# Patient Record
Sex: Female | Born: 1960 | Race: Black or African American | Hispanic: No | Marital: Married | Smoking: Current every day smoker
Health system: Southern US, Community
[De-identification: ages and names within clinical notes are randomized; demographics above are authoritative.]

## PROBLEM LIST (undated history)

## (undated) HISTORY — PX: OTHER SURGICAL HISTORY: SHX169

---

## 2019-05-31 ENCOUNTER — Ambulatory Visit (INDEPENDENT_AMBULATORY_CARE_PROVIDER_SITE_OTHER): Payer: PRIVATE HEALTH INSURANCE | Admitting: Internal Medicine

## 2019-05-31 ENCOUNTER — Other Ambulatory Visit: Payer: Self-pay

## 2019-05-31 ENCOUNTER — Encounter: Payer: Self-pay | Admitting: Internal Medicine

## 2019-05-31 VITALS — BP 118/58 | HR 68 | Temp 98.0°F | Ht 60.32 in | Wt 163.4 lb

## 2019-05-31 DIAGNOSIS — E041 Nontoxic single thyroid nodule: Secondary | ICD-10-CM

## 2019-05-31 DIAGNOSIS — R748 Abnormal levels of other serum enzymes: Secondary | ICD-10-CM | POA: Diagnosis not present

## 2019-05-31 DIAGNOSIS — E0590 Thyrotoxicosis, unspecified without thyrotoxic crisis or storm: Secondary | ICD-10-CM

## 2019-05-31 DIAGNOSIS — R799 Abnormal finding of blood chemistry, unspecified: Secondary | ICD-10-CM

## 2019-05-31 DIAGNOSIS — D729 Disorder of white blood cells, unspecified: Secondary | ICD-10-CM

## 2019-05-31 LAB — COMPREHENSIVE METABOLIC PANEL
ALT: 21 U/L (ref 0–35)
AST: 19 U/L (ref 0–37)
Albumin: 4.2 g/dL (ref 3.5–5.2)
Alkaline Phosphatase: 133 U/L — ABNORMAL HIGH (ref 39–117)
BUN: 12 mg/dL (ref 6–23)
CO2: 28 mEq/L (ref 19–32)
Calcium: 10 mg/dL (ref 8.4–10.5)
Chloride: 101 mEq/L (ref 96–112)
Creatinine, Ser: 1.01 mg/dL (ref 0.40–1.20)
GFR: 68.1 mL/min (ref >60.00)
Glucose, Bld: 115 mg/dL — ABNORMAL HIGH (ref 70–99)
Potassium: 3.4 mEq/L — ABNORMAL LOW (ref 3.5–5.1)
Sodium: 138 mEq/L (ref 135–145)
Total Bilirubin: 0.5 mg/dL (ref 0.2–1.2)
Total Protein: 7.4 g/dL (ref 6.0–8.3)

## 2019-05-31 LAB — CBC WITH DIFFERENTIAL/PLATELET
Basophils Absolute: 0 10*3/uL (ref 0.0–0.1)
Basophils Relative: 0.7 % (ref 0.0–3.0)
Eosinophils Absolute: 0.1 10*3/uL (ref 0.0–0.7)
Eosinophils Relative: 1.5 % (ref 0.0–5.0)
HCT: 41.6 % (ref 36.0–46.0)
Hemoglobin: 14 g/dL (ref 12.0–15.0)
Lymphocytes Relative: 49.1 % — ABNORMAL HIGH (ref 12.0–46.0)
Lymphs Abs: 3.4 10*3/uL (ref 0.7–4.0)
MCHC: 33.6 g/dL (ref 30.0–36.0)
MCV: 97.8 fl (ref 78.0–100.0)
Monocytes Absolute: 0.5 10*3/uL (ref 0.1–1.0)
Monocytes Relative: 7.4 % (ref 3.0–12.0)
Neutro Abs: 2.9 10*3/uL (ref 1.4–7.7)
Neutrophils Relative %: 41.3 % — ABNORMAL LOW (ref 43.0–77.0)
Platelets: 193 10*3/uL (ref 150.0–400.0)
RBC: 4.26 Mil/uL (ref 3.87–5.11)
RDW: 13.4 % (ref 11.5–15.5)
WBC: 7 10*3/uL (ref 4.0–10.5)

## 2019-05-31 LAB — T4, FREE: Free T4: 0.62 ng/dL (ref 0.60–1.60)

## 2019-05-31 LAB — TSH: TSH: 7.11 u[IU]/mL — ABNORMAL HIGH (ref 0.35–4.50)

## 2019-05-31 NOTE — Progress Notes (Signed)
Name: Julie Gutierrez  MRN/ DOB: [id number], 10-30-1960    Age/ Sex: 58 y.o., female    PCP: Shifflett, Lorelee Market   Reason for Endocrinology Evaluation: Hyperthyroidism     Date of Initial Endocrinology Evaluation: 06/01/2019     HPI: [name] is a 58 y.o. female with a past medical history of HTN, Dyslipidemia. The patient presented for initial endocrinology clinic visit on 06/01/2019 for consultative assistance with her hyperthyroidism.   Pt has been diagnosed with right thyroid nodule in her 30's. She recalls having a thyroid uptake and scan at the time as well as a biopsy with benign findings per pt. In 2019 she was started on Methimazole due to thyroid overactivity in 2019.  She also had a right nodule FNA on 6/19/2019with benign cytology per patient. (Previous endocrinology notes not available)    Medications Methimazole 10 mg daily   Today she denies any local neck symptoms such as discomfort , dysphagia or hoarseness.   Denies nausea or abdominal abdominal pain  Has gained weight over the past 5 months. Denies palpitations and diarrhea.   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  Past Surgical History: The histories are not reviewed yet. Please review them in the "History" navigator section and refresh this [place].   Social History:  reports that she has been smoking. She has never used smokeless tobacco. She reports that she does not drink alcohol or use drugs.  Family History: family history includes Julie Gutierrez in her mother; Julie Gutierrez in her father; Goiter in her sister.   HOME MEDICATIONS: Allergies as of 05/31/2019      Reactions   Prednisone Other (See Comments)   hallucinations   Sulfacetamide Hives      Medication List       Accurate as of May 31, 2019 11:59 PM. If you have any questions, ask your nurse or  doctor.        amLODipine 10 MG tablet Commonly known as: NORVASC Take by mouth.   atenolol 50 MG tablet Commonly known as: TENORMIN Take by mouth.   atorvastatin 20 MG tablet Commonly known as: LIPITOR Take 20 mg by mouth at bedtime.   escitalopram 5 MG tablet Commonly known as: LEXAPRO Take by mouth.   fexofenadine 60 MG tablet Commonly known as: ALLEGRA Take by mouth.   fluticasone 50 MCG/ACT nasal spray Commonly known as: FLONASE Place into the nose.   meclizine 25 MG tablet Commonly known as: ANTIVERT Take by mouth.   methimazole 10 MG tablet Commonly known as: TAPAZOLE Take 10 mg by mouth every morning.   multivitamin-iron-minerals-folic acid chewable tablet Chew 1 tablet by mouth daily.   omeprazole 20 MG capsule Commonly known as: PRILOSEC Take by mouth.   triamterene-hydrochlorothiazide 37.5-25 MG tablet Commonly known as: MAXZIDE-25 Take 1 tablet by mouth daily.         REVIEW OF SYSTEMS: A comprehensive ROS was conducted with the patient and is negative except as per HPI and below:  Review of Systems  Eyes: Negative for blurred vision and pain.  Respiratory: Negative for cough and shortness of breath.   Cardiovascular: Negative for chest pain and palpitations.  Gastrointestinal: Negative for diarrhea and nausea.  Neurological: Negative for tingling and tremors.  Psychiatric/Behavioral: Negative for depression. The patient is nervous/anxious.  OBJECTIVE:  VS: BP (!) 118/58 (BP Location: Right Arm, Patient Position: Sitting, Cuff Size: Large)    Pulse 68    Temp 98 F (36.7 C)    Ht 5' 0.32" (1.532 m)    Wt 163 lb 6.4 oz (74.1 kg)    SpO2 98%    BMI 31.58 kg/m    Wt Readings from Last 3 Encounters:  05/31/19 163 lb 6.4 oz (74.1 kg)     EXAM: General: Pt appears well and is in NAD  Hydration: Well-hydrated with moist mucous membranes and good skin turgor  Eyes: External eye exam normal without stare, lid lag or exophthalmos.   EOM intact.  PERRL.  Ears, Nose, Throat: Hearing: Grossly intact bilaterally Dental: Good dentition  Throat: Clear without mass, erythema or exudate  Neck: General: Supple without adenopathy. Thyroid: Thyroid size normal.  Isthmic nodule appreciated. No thyroid bruit.  Lungs: Clear with good BS bilat with no rales, rhonchi, or wheezes  Heart: Auscultation: RRR.  Abdomen: Normoactive bowel sounds, soft, nontender, without masses or organomegaly palpable  Extremities:  BL LE: No pretibial edema normal ROM and strength.  Skin: Hair: Texture and amount normal with gender appropriate distribution Skin Inspection: No rashes Skin Palpation: Skin temperature, texture, and thickness normal to palpation  Neuro: Cranial nerves: II - XII grossly intact  Motor: Normal strength throughout DTRs: 2+ and symmetric in UE without delay in relaxation phase  Mental Status: Judgment, insight: Intact Orientation: Oriented to time, place, and person Mood and affect: No depression, anxiety, or agitation     DATA REVIEWED:   Results for JANISHA, BUESO (MRN [id number]) as of 06/01/2019 08:02  Ref. Range 05/31/2019 14:41  Sodium Latest Ref Range: 135 - 145 mEq/L 138  Potassium Latest Ref Range: 3.5 - 5.1 mEq/L 3.4 (L)  Chloride Latest Ref Range: 96 - 112 mEq/L 101  CO2 Latest Ref Range: 19 - 32 mEq/L 28  Glucose Latest Ref Range: 70 - 99 mg/dL 115 (H)  BUN Latest Ref Range: 6 - 23 mg/dL 12  Creatinine Latest Ref Range: 0.40 - 1.20 mg/dL 1.01  Calcium Latest Ref Range: 8.4 - 10.5 mg/dL 10.0  Alkaline Phosphatase Latest Ref Range: 39 - 117 U/L 133 (H)  Albumin Latest Ref Range: 3.5 - 5.2 g/dL 4.2  AST Latest Ref Range: 0 - 37 U/L 19  ALT Latest Ref Range: 0 - 35 U/L 21  Total Protein Latest Ref Range: 6.0 - 8.3 g/dL 7.4  Total Bilirubin Latest Ref Range: 0.2 - 1.2 mg/dL 0.5  GFR Latest Ref Range: >60.00 mL/min 68.10  WBC Latest Ref Range: 4.0 - 10.5 K/uL 7.0  RBC Latest Ref Range: 3.87 - 5.11 Mil/uL 4.26    Hemoglobin Latest Ref Range: 12.0 - 15.0 g/dL 14.0  HCT Latest Ref Range: 36.0 - 46.0 % 41.6  MCV Latest Ref Range: 78.0 - 100.0 fl 97.8  MCHC Latest Ref Range: 30.0 - 36.0 g/dL 33.6  RDW Latest Ref Range: 11.5 - 15.5 % 13.4  Platelets Latest Ref Range: 150.0 - 400.0 K/uL 193.0  Neutrophils Latest Ref Range: 43.0 - 77.0 % 41.3 (L)  Lymphocytes Latest Ref Range: 12.0 - 46.0 % 49.1 (H)  Monocytes Relative Latest Ref Range: 3.0 - 12.0 % 7.4  Eosinophil Latest Ref Range: 0.0 - 5.0 % 1.5  Basophil Latest Ref Range: 0.0 - 3.0 % 0.7  NEUT# Latest Ref Range: 1.4 - 7.7 K/uL 2.9  Lymphocyte # Latest Ref Range: 0.7 - 4.0 K/uL 3.4  Monocyte #  Latest Ref Range: 0.1 - 1.0 K/uL 0.5  Eosinophils Absolute Latest Ref Range: 0.0 - 0.7 K/uL 0.1  Basophils Absolute Latest Ref Range: 0.0 - 0.1 K/uL 0.0  TSH Latest Ref Range: 0.35 - 4.50 uIU/mL 7.11 (H)  T4,Free(Direct) Latest Ref Range: 0.60 - 1.60 ng/dL 0.62    ASSESSMENT/PLAN/RECOMMENDATIONS:   1. Toxic Thyroid Nodule :   - This is per pt's history, no record available today  - She is clinically euthyroid  - We discussed that if she indeed has a toxic nodule , the treatment of choice is RAI ablation, if she declines that then thionamide therapy will be accepted.  - We discussed with pt the benefits of methimazole in the Tx of hyperthyroidism, as well as the possible side effects/complications of anti-thyroid drug Tx (specifically detailing the rare, but serious side effect of agranulocytosis). She was informed of need for regular thyroid function monitoring while on methimazole to ensure appropriate dosage without over-treatment. As well, we discussed the possible side effects of methimazole including the chance of rash, the small chance of liver irritation/juandice and the <=1 in 300-400 chance of sudden onset agranulocytosis.  We discussed importance of going to urgent care promptly (and stopping methimazole) if she were to develop significant fever with  severe sore throat of other evidence of acute infection.     - Carefully explained to the patient that one of the consequences of I-131 ablation treatment could be permanent hypothyroidism which would require long-term replacement therapy with LT4. - Will proceed with uptake and scan , pt reminded to hold off on methimazole intake 7 days prior to her scan appointment and to resume it after completion.   - TSH is elevated, will reduce methimazole dose as below   Medications : Hold Methimazole 10 mg for 2 days and restart at half a tablet daily    2. Low Neutrophil Count   - No baseline neutrophil count,unclear if this is related to methimazole or not - No evidence of active infection - Will monitor while reducing methimazole dose   3. Elevated Alkaline phosphatase:   - No baseline of Alk. Phos in the past , unclear if this related to methimazole  - Will continue to monitor with reduced dose of methimazole - No GI symptoms    F/u in 3 months     Signed electronically by: Mack Guise, MD  [facility] Endocrin[facility]  [facility] [address] [place], [place] [address] Phone: [phone] FAX: [phone]   CC: Shifflett, Lorelee Market [place] [address] Phone: [phone] Fax: [phone]   Return to Endocrinology clinic as below: Future Appointments  Date Time Provider [place]  08/17/2019 11:10 AM Zailyn Rowser, Melanie Crazier, MD [id number] None

## 2019-05-31 NOTE — Patient Instructions (Addendum)
We recommend that you follow these hyperthyroidism instructions at home:  1) Take Methimazole 10 mg, 1 tablet daily   If you develop severe sore throat with high fevers OR develop unexplained yellowing of your skin, eyes, under your tongue, severe abdominal pain with nausea or vomiting --> then please get evaluated immediately.    2) We will set you up for a thyroid Uptake and Scan - remember to hold Methimazole 7 days before your scan and you can go back on it after your scan is complete.

## 2019-06-01 ENCOUNTER — Telehealth: Payer: Self-pay | Admitting: Internal Medicine

## 2019-06-01 DIAGNOSIS — D729 Disorder of white blood cells, unspecified: Secondary | ICD-10-CM | POA: Insufficient documentation

## 2019-06-01 DIAGNOSIS — R799 Abnormal finding of blood chemistry, unspecified: Secondary | ICD-10-CM | POA: Insufficient documentation

## 2019-06-01 DIAGNOSIS — R748 Abnormal levels of other serum enzymes: Secondary | ICD-10-CM | POA: Insufficient documentation

## 2019-06-01 NOTE — Telephone Encounter (Signed)
lft vm to return call, name given on vm is not the name of pt

## 2019-06-01 NOTE — Telephone Encounter (Signed)
Please let her know the current dose of methimazole is too much for her. Her thyroid is currently underactive rather then overactive.     Hold methimazole for 2 days, then restart at half a tablet.    Please remind her to hold off on methimazole for a week prior to her appointment for the thyroid scan (I also wrote it on her AVS)   Thanks

## 2019-06-02 ENCOUNTER — Other Ambulatory Visit: Payer: Self-pay

## 2019-06-02 MED ORDER — METHIMAZOLE 10 MG PO TABS: 5.0000 mg | ORAL_TABLET | Freq: Every morning | ORAL | 1 refills | Status: DC

## 2019-06-02 NOTE — Telephone Encounter (Signed)
Spoke to pt and reviewed results, pt stated she understood results and changes also requested a refill which was sent.

## 2019-08-17 ENCOUNTER — Ambulatory Visit (INDEPENDENT_AMBULATORY_CARE_PROVIDER_SITE_OTHER): Payer: PRIVATE HEALTH INSURANCE | Admitting: Internal Medicine

## 2019-08-17 ENCOUNTER — Other Ambulatory Visit: Payer: Self-pay

## 2019-08-17 ENCOUNTER — Encounter: Payer: Self-pay | Admitting: Internal Medicine

## 2019-08-17 VITALS — BP 138/64 | HR 77 | Temp 98.1°F | Ht 60.0 in | Wt 162.4 lb

## 2019-08-17 DIAGNOSIS — R748 Abnormal levels of other serum enzymes: Secondary | ICD-10-CM

## 2019-08-17 DIAGNOSIS — R799 Abnormal finding of blood chemistry, unspecified: Secondary | ICD-10-CM

## 2019-08-17 DIAGNOSIS — E041 Nontoxic single thyroid nodule: Secondary | ICD-10-CM | POA: Diagnosis not present

## 2019-08-17 DIAGNOSIS — D729 Disorder of white blood cells, unspecified: Secondary | ICD-10-CM

## 2019-08-17 DIAGNOSIS — E0590 Thyrotoxicosis, unspecified without thyrotoxic crisis or storm: Secondary | ICD-10-CM

## 2019-08-17 NOTE — Progress Notes (Signed)
Name: Julie Gutierrez  MRN/ DOB: [id number], May 02, 1961    Age/ Sex: 58 y.o., female     PCP: Shifflett, Lorelee Market   Reason for Endocrinology Evaluation: Hyperthyroidism     Initial Endocrinology Clinic Visit: 05/31/2019    PATIENT IDENTIFIER: Julie Gutierrez is a 57 y.o., female with a past medical history of HTN and Dyslipidemia. She has followed with [facility] since 05/31/2019 for consultative assistance with management of her hyperthyroidism.   HISTORICAL SUMMARY: The patient has been diagnosed with right thyroid nodule in her 30's. She recalls having a thyroid uptake and scan at the time as well as a biopsy with benign findings per pt. In 2019 she was started on Methimazole due to thyroid overactivity.  She also had a right nodule FNA on 02/23/2018 with benign cytology per patient. 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  Today (08/17/2019):  Julie Gutierrez is here for a 3 month follow up on hyperthyroidism. She has been compliant with 5 mg of methimazole. She denies any side effects.   Denies constipation , or diarrhea Has occasional anxiety  Denies local neck symptoms    ROS:  As per HPI.   HISTORY:   Past Medical History: No past medical history on file.  Past Surgical History: The histories are not reviewed yet. Please review them in the "History" navigator section and refresh this [place].  Social History:  reports that she has been smoking. She has never used smokeless tobacco. She reports that she does not drink alcohol or use drugs. Family History:  Family History  Problem Relation Age of Onset  . Bladder Cancer Mother   . Diabetes Father   . Goiter Sister      HOME MEDICATIONS: Allergies as of 08/17/2019      Reactions   Prednisone Other (See Comments)   hallucinations   Sulfacetamide Hives      Medication List       Accurate as of August 17, 2019 11:10 AM. If you have any questions, ask your nurse or doctor.        amLODipine 10 MG tablet Commonly known as: NORVASC Take by mouth.   atenolol 50 MG tablet Commonly known as: TENORMIN Take by mouth.   atorvastatin 20 MG tablet Commonly known as: LIPITOR Take 20 mg by mouth at bedtime.   escitalopram 5 MG tablet Commonly known as: LEXAPRO Take by mouth.   fexofenadine 60 MG tablet Commonly known as: ALLEGRA Take by mouth.   fluticasone 50 MCG/ACT nasal spray Commonly known as: FLONASE Place into the nose.   meclizine 25 MG tablet Commonly known as: ANTIVERT Take by mouth.   methimazole 10 MG tablet Commonly known as: TAPAZOLE Take 0.5 tablets (5 mg total) by mouth every morning.   multivitamin-iron-minerals-folic acid chewable tablet Chew 1 tablet by mouth daily.   omeprazole 20 MG capsule Commonly known as: PRILOSEC Take by mouth.   triamterene-hydrochlorothiazide 37.5-25 MG tablet Commonly known as: MAXZIDE-25 Take 1 tablet by mouth daily.         OBJECTIVE:   PHYSICAL EXAM: VS: BP 138/64 (BP Location: Left Arm, Patient Position: Sitting, Cuff Size: Large)   Pulse 77   Temp 98.1 F (36.7 C)   Ht 5' (1.524 m)   Wt 162 lb 6.4 oz (73.7 kg)   LMP  (Exact Date)  SpO2 98%   BMI 31.72 kg/m    EXAM: General: Pt appears well and is in NAD  Neck: General: Supple without adenopathy. Thyroid: Thyroid size normal.  No goiter or nodules appreciated.  Lungs: Clear with good BS bilat with no rales, rhonchi, or wheezes  Heart: Auscultation: RRR.  Abdomen: Normoactive bowel sounds, soft, nontender, without masses or organomegaly palpable  Extremities: BL LE: No pretibial edema normal ROM and strength.  Mental Status: Judgment, insight: Intact Orientation: Oriented to time, place, and person Mood and affect: No depression, anxiety, or agitation     DATA REVIEWED:  Results for Julie, Gutierrez (MRN [id number]) as of 08/17/2019 11:55  Ref. Range 05/31/2019 14:41  Sodium Latest Ref Range: 135 - 145 mEq/L 138  Potassium Latest Ref Range: 3.5 - 5.1 mEq/L 3.4 (L)  Chloride Latest Ref Range: 96 - 112 mEq/L 101  CO2 Latest Ref Range: 19 - 32 mEq/L 28  Glucose Latest Ref Range: 70 - 99 mg/dL 115 (H)  BUN Latest Ref Range: 6 - 23 mg/dL 12  Creatinine Latest Ref Range: 0.40 - 1.20 mg/dL 1.01  Calcium Latest Ref Range: 8.4 - 10.5 mg/dL 10.0  Alkaline Phosphatase Latest Ref Range: 39 - 117 U/L 133 (H)  Albumin Latest Ref Range: 3.5 - 5.2 g/dL 4.2  AST Latest Ref Range: 0 - 37 U/L 19  ALT Latest Ref Range: 0 - 35 U/L 21  Total Protein Latest Ref Range: 6.0 - 8.3 g/dL 7.4  Total Bilirubin Latest Ref Range: 0.2 - 1.2 mg/dL 0.5  GFR Latest Ref Range: >60.00 mL/min 68.10  WBC Latest Ref Range: 4.0 - 10.5 K/uL 7.0  RBC Latest Ref Range: 3.87 - 5.11 Mil/uL 4.26  Hemoglobin Latest Ref Range: 12.0 - 15.0 g/dL 14.0  HCT Latest Ref Range: 36.0 - 46.0 % 41.6  MCV Latest Ref Range: 78.0 - 100.0 fl 97.8  MCHC Latest Ref Range: 30.0 - 36.0 g/dL 33.6  RDW Latest Ref Range: 11.5 - 15.5 % 13.4  Platelets Latest Ref Range: 150.0 - 400.0 K/uL 193.0  Neutrophils Latest Ref Range: 43.0 - 77.0 % 41.3 (L)  Lymphocytes Latest Ref Range: 12.0 - 46.0 % 49.1 (H)  Monocytes Relative Latest Ref Range: 3.0 - 12.0 % 7.4  Eosinophil Latest Ref Range: 0.0 - 5.0 % 1.5  Basophil Latest Ref Range: 0.0 - 3.0 % 0.7  NEUT# Latest Ref Range: 1.4 - 7.7 K/uL 2.9  Lymphocyte # Latest Ref Range: 0.7 - 4.0 K/uL 3.4  Monocyte # Latest Ref Range: 0.1 - 1.0 K/uL 0.5  Eosinophils Absolute Latest Ref Range: 0.0 - 0.7 K/uL 0.1  Basophils Absolute Latest Ref Range: 0.0 - 0.1 K/uL 0.0  TSH Latest Ref Range: 0.35 - 4.50 uIU/mL 7.11 (H)  T4,Free(Direct) Latest Ref Range: 0.60 - 1.60 ng/dL 0.62    ASSESSMENT / PLAN / RECOMMENDATIONS:   1. Toxic Thyroid Nodule :   - She is clinically euthyroid  - No  local neck symptoms  - On the previous visit we discussed the treatment of choice is RAI ablation with toxic nodules, if she declines that then thionamide therapy will be accepted. Pt agreed to proceed with uptake and scan  But we have been unable to get approval from her insurance company.  - Prior FNA of the right nodule with benign cytology (02/2018)   Medications : Methimazole 10 mg , half tablet daily     2. Low Neutrophil Count   - No baseline neutrophil count,unclear if this is related  to methimazole or not - No evidence of active infection - Will monitor   3. Elevated Alkaline phosphatase:   - No baseline of Alk. Phos in the past , unclear if this related to methimazole  - Will continue to monitor  - No GI symptoms   We do not have a phlebotomist available in the office today, pt will have labs drawn at PCP's office next week, pt instructed of the need of the following (TSH, FT4, CMP and CBC with Diff)   F/U in 4 months    Signed electronically by: Mack Guise, MD  [facility] Endocrinology  [facility] [address] [place], [place] [address] Phone: [phone] FAX: [phone]      CC: Shifflett, [place] [place] [address] Phone: [phone]  Fax: [phone]   Return to Endocrinology clinic as below: No future appointments.

## 2019-08-17 NOTE — Patient Instructions (Signed)
-   Please have the following labs drawn  if possible :  CBC, CMP, TSH and Free T4  Please have the results faxed to [phone]

## 2019-08-18 ENCOUNTER — Encounter: Payer: Self-pay | Admitting: Internal Medicine

## 2019-08-18 ENCOUNTER — Telehealth: Payer: Self-pay

## 2019-08-18 NOTE — Telephone Encounter (Signed)
Contacted pt insurance, [employer] for clearance was told pt is on a PPO w/ multi plan and that does not require a pre [place]. Scheduled pt for 08/29/19 @9 :45 and return @ 2 and for 08/30/19 @ 10 for scan @ [facility]. Called to inform pt of appt and lft vm to return my call

## 2019-08-21 NOTE — Telephone Encounter (Signed)
Lft vm on husband # which is in her contacts in her chart for pt to return call to inform her of appt scheduled for her scan

## 2019-08-22 NOTE — Telephone Encounter (Signed)
Cancelled appt due to not being able to reach pt to inform her of appt date and instructions

## 2019-08-29 ENCOUNTER — Encounter (HOSPITAL_COMMUNITY): Payer: PRIVATE HEALTH INSURANCE

## 2019-08-30 ENCOUNTER — Encounter (HOSPITAL_COMMUNITY): Payer: PRIVATE HEALTH INSURANCE

## 2019-08-30 NOTE — Telephone Encounter (Signed)
ATC pt to check in regarding this testing, as it is clear for her to schedule anytime.  No answer and no voicemail

## 2019-10-05 ENCOUNTER — Other Ambulatory Visit: Payer: Self-pay

## 2019-10-05 ENCOUNTER — Telehealth: Payer: Self-pay

## 2019-10-05 MED ORDER — METHIMAZOLE 10 MG PO TABS: 5.0000 mg | ORAL_TABLET | Freq: Every morning | ORAL | 1 refills | Status: DC

## 2019-10-05 NOTE — Telephone Encounter (Signed)
MEDICATION: methimazole  PHARMACY: [facility]   IS THIS A 90 DAY SUPPLY : yes  IS PATIENT OUT OF MEDICATION: no  IF NOT; HOW MUCH IS LEFT: 1 day  LAST APPOINTMENT DATE: @12 /07/2019  NEXT APPOINTMENT DATE:@4 /15/2021  DO WE HAVE YOUR PERMISSION TO LEAVE A DETAILED MESSAGE:  OTHER COMMENTS: patient is stating she has contacted the pharmacy and they told her they have sent several requests to this office and she was told to call our office   **Let patient know to contact pharmacy at the end of the day to make sure medication is ready. **  ** Please notify patient to allow 48-72 hours to process**  **Encourage patient to contact the pharmacy for refills or they can request refills through [facility]**

## 2019-10-05 NOTE — Telephone Encounter (Signed)
Refill sent.

## 2019-11-15 ENCOUNTER — Encounter: Payer: Self-pay | Admitting: Internal Medicine

## 2019-12-04 NOTE — Progress Notes (Signed)
Error

## 2019-12-21 ENCOUNTER — Ambulatory Visit: Payer: PRIVATE HEALTH INSURANCE | Admitting: Internal Medicine

## 2020-01-31 ENCOUNTER — Other Ambulatory Visit: Payer: Self-pay

## 2020-01-31 MED ORDER — METHIMAZOLE 10 MG PO TABS: 5.0000 mg | ORAL_TABLET | Freq: Every morning | ORAL | 1 refills | Status: DC

## 2020-03-04 ENCOUNTER — Telehealth: Payer: Self-pay | Admitting: Physician Assistant

## 2020-03-04 NOTE — Telephone Encounter (Signed)
FYI

## 2020-03-04 NOTE — Telephone Encounter (Signed)
Patient called in wanting to update Dr.Shamleffer that she has not been able to follow through with the referral that was placed due to insurance purposes.

## 2020-06-03 ENCOUNTER — Other Ambulatory Visit: Payer: Self-pay

## 2020-06-03 ENCOUNTER — Telehealth: Payer: Self-pay | Admitting: Internal Medicine

## 2020-06-03 NOTE — Telephone Encounter (Signed)
Last office visit 08/17/2019, advised to follow up in 4 months  Last set of labs completed 1 year ago  Future office visit scheduled? Yes 06/14/2020  Please advise on refill.

## 2020-06-03 NOTE — Telephone Encounter (Signed)
Patient is overdue for a follow up appointment Please review for refill

## 2020-06-03 NOTE — Telephone Encounter (Signed)
Medication Refill Request  Did you call your pharmacy and request this refill first? Yes  . If patient has not contacted pharmacy first, instruct them to do so for future refills.  . Remind them that contacting the pharmacy for their refill is the quickest method to get the refill.  . Refill policy also stated that it will take anywhere between 24-72 hours to receive the refill.    Name of medication? methimazole  Is this a 90 day supply? If allowed  Name and location of pharmacy?  [address] [place], [place] - [place] - [address] Phone:  [phone]  Fax:  [phone]

## 2020-06-04 MED ORDER — METHIMAZOLE 10 MG PO TABS: 5.0000 mg | ORAL_TABLET | Freq: Every morning | ORAL | 0 refills | Status: DC

## 2020-06-04 NOTE — Telephone Encounter (Signed)
Shamleffer, Konrad Dolores, MD  You 15 hours ago (4:32 PM)   Please give her 30 days supply then since she has a future appointment.    Message text    30 day supply sent per Dr. Lonzo Cloud.

## 2020-06-14 ENCOUNTER — Ambulatory Visit (INDEPENDENT_AMBULATORY_CARE_PROVIDER_SITE_OTHER): Payer: PRIVATE HEALTH INSURANCE | Admitting: Internal Medicine

## 2020-06-14 ENCOUNTER — Other Ambulatory Visit: Payer: Self-pay

## 2020-06-14 VITALS — BP 118/60 | HR 79 | Ht 60.5 in | Wt 150.6 lb

## 2020-06-14 DIAGNOSIS — D729 Disorder of white blood cells, unspecified: Secondary | ICD-10-CM | POA: Diagnosis not present

## 2020-06-14 DIAGNOSIS — E0590 Thyrotoxicosis, unspecified without thyrotoxic crisis or storm: Secondary | ICD-10-CM

## 2020-06-14 DIAGNOSIS — E041 Nontoxic single thyroid nodule: Secondary | ICD-10-CM

## 2020-06-14 DIAGNOSIS — R748 Abnormal levels of other serum enzymes: Secondary | ICD-10-CM | POA: Diagnosis not present

## 2020-06-14 LAB — COMPREHENSIVE METABOLIC PANEL
ALT: 13 U/L (ref 0–35)
AST: 17 U/L (ref 0–37)
Albumin: 4.2 g/dL (ref 3.5–5.2)
Alkaline Phosphatase: 99 U/L (ref 39–117)
BUN: 15 mg/dL (ref 6–23)
CO2: 26 mEq/L (ref 19–32)
Calcium: 9.9 mg/dL (ref 8.4–10.5)
Chloride: 103 mEq/L (ref 96–112)
Creatinine, Ser: 1.01 mg/dL (ref 0.40–1.20)
GFR: 60.83 mL/min (ref >60.00)
Glucose, Bld: 87 mg/dL (ref 70–99)
Potassium: 3.7 mEq/L (ref 3.5–5.1)
Sodium: 138 mEq/L (ref 135–145)
Total Bilirubin: 0.5 mg/dL (ref 0.2–1.2)
Total Protein: 7.5 g/dL (ref 6.0–8.3)

## 2020-06-14 LAB — CBC WITH DIFFERENTIAL/PLATELET
Basophils Absolute: 0 10*3/uL (ref 0.0–0.1)
Basophils Relative: 0.5 % (ref 0.0–3.0)
Eosinophils Absolute: 0.1 10*3/uL (ref 0.0–0.7)
Eosinophils Relative: 0.9 % (ref 0.0–5.0)
HCT: 42.4 % (ref 36.0–46.0)
Hemoglobin: 14.1 g/dL (ref 12.0–15.0)
Lymphocytes Relative: 37.4 % (ref 12.0–46.0)
Lymphs Abs: 2.8 10*3/uL (ref 0.7–4.0)
MCHC: 33.3 g/dL (ref 30.0–36.0)
MCV: 96.7 fl (ref 78.0–100.0)
Monocytes Absolute: 0.6 10*3/uL (ref 0.1–1.0)
Monocytes Relative: 8 % (ref 3.0–12.0)
Neutro Abs: 3.9 10*3/uL (ref 1.4–7.7)
Neutrophils Relative %: 53.2 % (ref 43.0–77.0)
Platelets: 238 10*3/uL (ref 150.0–400.0)
RBC: 4.38 Mil/uL (ref 3.87–5.11)
RDW: 13.7 % (ref 11.5–15.5)
WBC: 7.4 10*3/uL (ref 4.0–10.5)

## 2020-06-14 LAB — TSH: TSH: 0.79 u[IU]/mL (ref 0.35–4.50)

## 2020-06-14 LAB — T4, FREE: Free T4: 0.79 ng/dL (ref 0.60–1.60)

## 2020-06-14 NOTE — Progress Notes (Signed)
Name: Julie Gutierrez  MRN/ DOB: [id number], 02/18/1961    Age/ Sex: 59 y.o., female     PCP: Shifflett, Lorelee Market   Reason for Endocrinology Evaluation: Hyperthyroidism     Initial Endocrinology Clinic Visit: 05/31/2019    PATIENT IDENTIFIER: Julie Gutierrez is a 59 y.o., female with a past medical history of HTN and Dyslipidemia. She has followed with [facility] since 05/31/2019 for consultative assistance with management of her hyperthyroidism.   HISTORICAL SUMMARY: The patient has been diagnosed with right thyroid nodule in her 30's. She recalls having a thyroid uptake and scan at the time as well as a biopsy with benign findings per pt. In 2019 she was started on Methimazole due to thyroid overactivity.  She also had a right nodule FNA on 02/23/2018 with benign cytology per patient. (Previous endocrinology notes not available)  Sister with thyroid nodules SUBJECTIVE:    Today (06/14/2020):  [name] is here for a  follow up on hyperthyroidism. She has NOT been to our office in the past 10 months.     She has been compliant with 10 mg of methimazole, half a tablet daily .   Has occasional diarrhea that she attributes to Metformin  Denies palpitations  Denies local neck symptoms    HISTORY:   Past Medical History: No past medical history on file.  Past Surgical History: The histories are not reviewed yet. Please review them in the "History" navigator section and refresh this [place].  Social History:  reports that she has been smoking. She has never used smokeless tobacco. She reports that she does not drink alcohol and does not use drugs. Family History:  Family History  Problem Relation Age of Onset  . Bladder Cancer Mother   . Diabetes Father   . Goiter Sister      HOME MEDICATIONS: Allergies as of 06/14/2020      Reactions   Prednisone Other (See Comments)   hallucinations   Sulfacetamide Hives      Medication List        Accurate as of June 14, 2020 10:38 AM. If you have any questions, ask your nurse or doctor.        amLODipine 10 MG tablet Commonly known as: NORVASC Take by mouth.   atenolol 50 MG tablet Commonly known as: TENORMIN Take by mouth.   atorvastatin 20 MG tablet Commonly known as: LIPITOR Take 20 mg by mouth at bedtime.   escitalopram 5 MG tablet Commonly known as: LEXAPRO Take by mouth.   fexofenadine 60 MG tablet Commonly known as: ALLEGRA Take by mouth.   fluticasone 50 MCG/ACT nasal spray Commonly known as: FLONASE Place into the nose.   meclizine 25 MG tablet Commonly known as: ANTIVERT Take by mouth.   METFORMIN HCL PO Take 1,000 mg by mouth. Takes half a tablet daily, then another half as needed   methimazole 10 MG tablet Commonly known as: TAPAZOLE Take 0.5 tablets (5 mg total) by mouth every morning. 30 day supply until appointment   multivitamin-iron-minerals-folic acid chewable tablet Chew 1 tablet by mouth daily.   omeprazole 20 MG capsule Commonly known as: PRILOSEC Take by mouth.   triamterene-hydrochlorothiazide 37.5-25 MG tablet Commonly known as: MAXZIDE-25 Take 1 tablet by mouth daily.         OBJECTIVE:   PHYSICAL EXAM: VS: BP 118/60 (BP Location: Left Arm, Patient Position: Sitting, Cuff Size: Normal)   Pulse 79   Ht 5' 0.5" (1.537 m)  Wt 150 lb 9.6 oz (68.3 kg)   SpO2 96%   BMI 28.93 kg/m    EXAM: General: Pt appears well and is in NAD  Neck: General: Supple without adenopathy. Thyroid: Thyroid size normal.left isthmic nodule ~ 1.5 cm   Lungs: Clear with good BS bilat with no rales, rhonchi, or wheezes  Heart: Auscultation: RRR.  Abdomen: Normoactive bowel sounds, soft, nontender, without masses or organomegaly palpable  Extremities: BL LE: No pretibial edema normal ROM and strength.  Mental Status: Judgment, insight: Intact Orientation: Oriented to time, place, and person Mood and affect: No depression, anxiety, or  agitation     DATA REVIEWED: Results for EZZIE, SENAT (MRN [id number]) as of 06/17/2020 07:47  Ref. Range 06/14/2020 11:01  Sodium Latest Ref Range: 135 - 145 mEq/L 138  Potassium Latest Ref Range: 3.5 - 5.1 mEq/L 3.7  Chloride Latest Ref Range: 96 - 112 mEq/L 103  CO2 Latest Ref Range: 19 - 32 mEq/L 26  Glucose Latest Ref Range: 70 - 99 mg/dL 87  BUN Latest Ref Range: 6 - 23 mg/dL 15  Creatinine Latest Ref Range: 0.40 - 1.20 mg/dL 1.01  Calcium Latest Ref Range: 8.4 - 10.5 mg/dL 9.9  Alkaline Phosphatase Latest Ref Range: 39 - 117 U/L 99  Albumin Latest Ref Range: 3.5 - 5.2 g/dL 4.2  AST Latest Ref Range: 0 - 37 U/L 17  ALT Latest Ref Range: 0 - 35 U/L 13  Total Protein Latest Ref Range: 6.0 - 8.3 g/dL 7.5  Total Bilirubin Latest Ref Range: 0.2 - 1.2 mg/dL 0.5  GFR Latest Ref Range: >60.00 mL/min 60.83  WBC Latest Ref Range: 4.0 - 10.5 K/uL 7.4  RBC Latest Ref Range: 3.87 - 5.11 Mil/uL 4.38  Hemoglobin Latest Ref Range: 12.0 - 15.0 g/dL 14.1  HCT Latest Ref Range: 36 - 46 % 42.4  MCV Latest Ref Range: 78.0 - 100.0 fl 96.7  MCHC Latest Ref Range: 30.0 - 36.0 g/dL 33.3  RDW Latest Ref Range: 11.5 - 15.5 % 13.7  Platelets Latest Ref Range: 150 - 400 K/uL 238.0  Neutrophils Latest Ref Range: 43 - 77 % 53.2  Lymphocytes Latest Ref Range: 12 - 46 % 37.4  Monocytes Relative Latest Ref Range: 3 - 12 % 8.0  Eosinophil Latest Ref Range: 0 - 5 % 0.9  Basophil Latest Ref Range: 0 - 3 % 0.5  NEUT# Latest Ref Range: 1.4 - 7.7 K/uL 3.9  Lymphocyte # Latest Ref Range: 0.7 - 4.0 K/uL 2.8  Monocyte # Latest Ref Range: 0.1 - 1.0 K/uL 0.6  Eosinophils Absolute Latest Ref Range: 0 - 0 K/uL 0.1  Basophils Absolute Latest Ref Range: 0 - 0 K/uL 0.0  TSH Latest Ref Range: 0.35 - 4.50 uIU/mL 0.79  T4,Free(Direct) Latest Ref Range: 0.60 - 1.60 ng/dL 0.79     ASSESSMENT / PLAN / RECOMMENDATIONS:   1. Hyperthyroidism :  - She is clinically euthyroid  - No local neck symptoms    Medications  : Continue Methimazole 10 mg , half tablet daily    2.  MNG :  - No local neck symptoms.  - Prior FNA of the right nodule with benign cytology (02/2018)  - Previous records unavailable  - Will proceed with thyroid ultrasound  - On the previous visit we discussed the treatment of choice is RAI ablation with toxic nodules, but we had issues with her insurance and were unable  to proceed with uptake and scan and we opted to continue  with methimazole     3. Low Neutrophil Count   - No baseline neutrophil count,unclear if this is related to methimazole or not - No evidence of active infection - Repeat CBC is normal   4. Elevated Alkaline phosphatase:   - No baseline of Alk. Phos in the past , unclear if this related to methimazole  - Repeat alkaline phosphatase has resolved      F/U in 4 months    Signed electronically by: Mack Guise, MD  [facility]  [facility] [address] [place], [place] [address] Phone: [phone] FAX: [phone]      CC: Shifflett, Lorelee Market [place] [address] Phone: [phone]  Fax: [phone]   Return to Endocrinology clinic as below: No future appointments.

## 2020-06-14 NOTE — Patient Instructions (Signed)
-   Stop by th labs today

## 2020-06-17 ENCOUNTER — Encounter: Payer: Self-pay | Admitting: Internal Medicine

## 2020-06-17 MED ORDER — METHIMAZOLE 10 MG PO TABS
5.0000 mg | ORAL_TABLET | Freq: Every morning | ORAL | 3 refills | Status: DC
Start: 2020-06-17 — End: 2020-10-22

## 2020-06-20 ENCOUNTER — Encounter: Payer: Self-pay | Admitting: Internal Medicine

## 2020-06-21 ENCOUNTER — Encounter: Payer: Self-pay | Admitting: Internal Medicine

## 2020-06-21 NOTE — Telephone Encounter (Signed)
Spoken to patient. Notified Dr Harvel Ricks comments. Patient verbalized understanding.

## 2020-06-24 ENCOUNTER — Ambulatory Visit
Admission: RE | Admit: 2020-06-24 | Discharge: 2020-06-24 | Disposition: A | Discharge status: Home / Self Care | Payer: PRIVATE HEALTH INSURANCE | Source: Ambulatory Visit | Attending: Internal Medicine | Admitting: Internal Medicine

## 2020-06-24 DIAGNOSIS — E041 Nontoxic single thyroid nodule: Secondary | ICD-10-CM

## 2020-06-25 ENCOUNTER — Telehealth: Payer: Self-pay | Admitting: Internal Medicine

## 2020-06-25 ENCOUNTER — Encounter: Payer: Self-pay | Admitting: Internal Medicine

## 2020-06-25 DIAGNOSIS — E042 Nontoxic multinodular goiter: Secondary | ICD-10-CM

## 2020-06-25 NOTE — Telephone Encounter (Signed)
Discussed thyroid ultrasound results as below     Parenchymal Echotexture: Mildly heterogenous  Isthmus: 0.5 cm  Right lobe: 6.3 x 2.3 x 3.4 cm  Left lobe: 4.8 x 1.9 x 2.7 cm  _________________________________________________________  Estimated total number of nodules >/= 1 cm: 5  Number of spongiform nodules >/=  2 cm not described below (TR1): 0  Number of mixed cystic and solid nodules >/= 1.5 cm not described below (TR2): 0  _________________________________________________________  Nodule # 1:  Location: Isthmus; Inferior, left  Maximum size: 1.7 cm; Other 2 dimensions: 1.1 x 1.3 cm  Composition: cannot determine (2)  Echogenicity: cannot determine (1)  Shape: not taller-than-wide (0)  Margins: ill-defined (0)  Echogenic foci: peripheral calcifications (2)  ACR TI-RADS total points: 5.  ACR TI-RADS risk category: TR4 (4-6 points).  ACR TI-RADS recommendations:  **Given size (>/= 1.5 cm) and appearance, fine needle aspiration of this moderately suspicious nodule should be considered based on TI-RADS criteria.  _________________________________________________________  Nodule # 2:  Location: Right; Superior  Maximum size: 1.2 cm; Other 2 dimensions: 0.9 x 1.2 cm  Composition: solid/almost completely solid (2)  Echogenicity: isoechoic (1)  Shape: not taller-than-wide (0)  Margins: ill-defined (0)  Echogenic foci: peripheral calcifications (2)  ACR TI-RADS total points: 5.  ACR TI-RADS risk category: TR4 (4-6 points).  ACR TI-RADS recommendations:  *Given size (>/= 1 - 1.4 cm) and appearance, a follow-up ultrasound in 1 year should be considered based on TI-RADS criteria.  _________________________________________________________  Nodule # 3:  Location: Right; Mid  Maximum size: 2.8 cm; Other 2 dimensions: 2.6 x 1.3 cm  Composition: mixed cystic and solid (1)  Echogenicity: isoechoic  (1)  Shape: not taller-than-wide (0)  Margins: smooth (0)  Echogenic foci: none (0)  ACR TI-RADS total points: 2.  ACR TI-RADS risk category: TR2 (2 points).  ACR TI-RADS recommendations:  This nodule does NOT meet TI-RADS criteria for biopsy or dedicated follow-up.  _________________________________________________________  Nodule # 4:  Location: Right; Inferior  Maximum size: 0.8 cm; Other 2 dimensions: 0.7 x 0.7 cm  Composition: solid/almost completely solid (2)  Echogenicity: hypoechoic (2)  Shape: not taller-than-wide (0)  Margins: ill-defined (0)  Echogenic foci: none (0)  ACR TI-RADS total points: 4.  ACR TI-RADS risk category: TR4 (4-6 points).  ACR TI-RADS recommendations:  Given size (<0.9 cm) and appearance, this nodule does NOT meet TI-RADS criteria for biopsy or dedicated follow-up.  _________________________________________________________  Nodule # 5:  Location: Left; Inferior  Maximum size: 1.9 cm; Other 2 dimensions: 1.4 x 1.5 cm  Composition: cannot determine (2)  Echogenicity: cannot determine (1)  Shape: not taller-than-wide (0)  Margins: ill-defined (0)  Echogenic foci: peripheral calcifications (2)  ACR TI-RADS total points: 5.  ACR TI-RADS risk category: TR4 (4-6 points).  ACR TI-RADS recommendations:  **Given size (>/= 1.5 cm) and appearance, fine needle aspiration of this moderately suspicious nodule should be considered based on TI-RADS criteria.  _________________________________________________________  Nodule # 6:  Location: Left; Inferior  Maximum size: 2.5 cm; Other 2 dimensions: 2.2 x 1.2 cm  Composition: mixed cystic and solid (1)  Echogenicity: cannot determine (1)  Shape: not taller-than-wide (0)  Margins: ill-defined (0)  Echogenic foci: none (0)  ACR TI-RADS total points: 2.  ACR TI-RADS risk category: TR2 (2 points).  ACR TI-RADS  recommendations:  This nodule does NOT meet TI-RADS criteria for biopsy or dedicated follow-up.  _________________________________________________________  IMPRESSION: 1. Multiple thyroid nodules. 2. Nodule #1 and Nodule #5 are  TR 4 nodules and both meet criteria for ultrasound-guided biopsy. Recommend correlation with prior biopsy reports if available. 3. Nodule #2 in the right superior thyroid lobe meets criteria for 1 year follow-up.    Pt in agreement with proceeding with thyroid FNA

## 2020-07-03 ENCOUNTER — Other Ambulatory Visit: Payer: Medicaid Other

## 2020-07-04 ENCOUNTER — Other Ambulatory Visit: Payer: Medicaid Other

## 2020-07-09 ENCOUNTER — Other Ambulatory Visit: Payer: Medicaid Other

## 2020-07-11 ENCOUNTER — Other Ambulatory Visit: Payer: Medicaid Other

## 2020-07-23 ENCOUNTER — Other Ambulatory Visit (HOSPITAL_COMMUNITY)
Admission: RE | Admit: 2020-07-23 | Discharge: 2020-07-23 | Disposition: A | Discharge status: Home / Self Care | Payer: Medicaid Other | Source: Ambulatory Visit | Attending: Radiology | Admitting: Radiology

## 2020-07-23 ENCOUNTER — Ambulatory Visit
Admission: RE | Admit: 2020-07-23 | Discharge: 2020-07-23 | Disposition: A | Discharge status: Home / Self Care | Payer: Medicaid Other | Source: Ambulatory Visit | Attending: Internal Medicine | Admitting: Internal Medicine

## 2020-07-23 DIAGNOSIS — E042 Nontoxic multinodular goiter: Secondary | ICD-10-CM

## 2020-07-25 LAB — CYTOLOGY - NON PAP

## 2020-07-30 ENCOUNTER — Encounter: Payer: Self-pay | Admitting: Internal Medicine

## 2020-10-18 ENCOUNTER — Encounter: Payer: Self-pay | Admitting: Internal Medicine

## 2020-10-18 ENCOUNTER — Ambulatory Visit: Payer: Medicaid Other | Admitting: Internal Medicine

## 2020-10-18 ENCOUNTER — Other Ambulatory Visit: Payer: Self-pay

## 2020-10-18 VITALS — BP 112/72 | HR 82 | Ht 66.0 in | Wt 144.1 lb

## 2020-10-18 DIAGNOSIS — E0590 Thyrotoxicosis, unspecified without thyrotoxic crisis or storm: Secondary | ICD-10-CM | POA: Diagnosis not present

## 2020-10-18 DIAGNOSIS — E042 Nontoxic multinodular goiter: Secondary | ICD-10-CM

## 2020-10-18 LAB — T4, FREE: Free T4: 0.79 ng/dL (ref 0.60–1.60)

## 2020-10-18 LAB — TSH: TSH: 1.53 u[IU]/mL (ref 0.35–4.50)

## 2020-10-18 NOTE — Progress Notes (Signed)
Name: Julie Gutierrez  MRN/ DOB: [id number], [birth year]    Age/ Sex: 60 y.o., female     PCP: Julie Gutierrez   Reason for Endocrinology Evaluation: Hyperthyroidism     Initial Endocrinology Clinic Visit: 05/31/2019    PATIENT IDENTIFIER: [name] is a 60 y.o., female with a past medical history of HTN and Dyslipidemia. She has followed with [facility] since 05/31/2019 for consultative assistance with management of her hyperthyroidism.   HISTORICAL SUMMARY: The patient has been diagnosed with right thyroid nodule in her 30's. She recalls having a thyroid uptake and scan at the time as well as a biopsy with benign findings per pt. In 2019 she was started on Methimazole due to thyroid overactivity.  She also had a right nodule FNA on 02/23/2018 with benign cytology per patient. (Previous endocrinology notes not available)   Se is S/P FNA of the two left inferior nodules ( 1.9 cm and 1.7 cm )  With scan cellularity (07/2020)  Sister with thyroid nodules  SUBJECTIVE:    Today (10/18/2020):  [name] is here for a  follow up on hyperthyroidism.     She has been compliant with 10 mg of methimazole, half a tablet daily .  Denies diarrhea   Denies palpitations  Has occasional anxiety , attributes to brother's loss in 07/2020  Denies local neck symptoms    HISTORY:   Past Medical History: No past medical history on file.  Past Surgical History: The histories are not reviewed yet. Please review them in the "History" navigator section and refresh this [place].  Social History:  reports that she has been smoking. She has never used smokeless tobacco. She reports that she does not drink alcohol and does not use drugs. Family History:  Family History  Problem Relation Age of Onset  . Bladder Cancer Mother   . Diabetes Father   . Goiter Sister      HOME MEDICATIONS: Allergies as of 10/18/2020      Reactions   Prednisone Other (See Comments)    hallucinations   Sulfacetamide Hives      Medication List       Accurate as of October 18, 2020  2:52 PM. If you have any questions, ask your nurse or doctor.        amLODipine 10 MG tablet Commonly known as: NORVASC Take by mouth.   atenolol 50 MG tablet Commonly known as: TENORMIN Take by mouth.   atorvastatin 20 MG tablet Commonly known as: LIPITOR Take 20 mg by mouth at bedtime.   escitalopram 5 MG tablet Commonly known as: LEXAPRO Take by mouth.   fexofenadine 60 MG tablet Commonly known as: ALLEGRA Take by mouth.   fluticasone 50 MCG/ACT nasal spray Commonly known as: FLONASE Place into the nose.   meclizine 25 MG tablet Commonly known as: ANTIVERT Take by mouth.   METFORMIN HCL PO Take 1,000 mg by mouth. Takes half a tablet daily, then another half as needed   methimazole 10 MG tablet Commonly known as: TAPAZOLE Take 0.5 tablets (5 mg total) by mouth every morning. 30 day supply until appointment   multivitamin-iron-minerals-folic acid chewable tablet Chew 1 tablet by mouth daily.   omeprazole 20 MG capsule Commonly known as: PRILOSEC Take by mouth.   triamterene-hydrochlorothiazide 37.5-25 MG tablet Commonly known as: MAXZIDE-25 Take 1 tablet by mouth daily.         OBJECTIVE:   PHYSICAL EXAM: VS: BP 112/72   Pulse 82  Ht 5\' 6"  (1.676 m)   Wt 144 lb 2 oz (65.4 kg)   SpO2 96%   BMI 23.26 kg/m    EXAM: General: Pt appears well and is in NAD  Neck: General: Supple without adenopathy. Thyroid: Thyroid size normal.left isthmic nodule ~ 1.5 cm   Lungs: Clear with good BS bilat with no rales, rhonchi, or wheezes  Heart: Auscultation: RRR.  Abdomen: Normoactive bowel sounds, soft, nontender, without masses or organomegaly palpable  Extremities: BL LE: No pretibial edema normal ROM and strength.  Mental Status: Judgment, insight: Intact Orientation: Oriented to time, place, and person Mood and affect: No depression, anxiety, or  agitation     DATA REVIEWED: Results for Julie Gutierrez (MRN Julie Gutierrez) as of 06/17/2020 07:47  Ref. Range 06/14/2020 11:01  Sodium Latest Ref Range: 135 - 145 mEq/L 138  Potassium Latest Ref Range: 3.5 - 5.1 mEq/L 3.7  Chloride Latest Ref Range: 96 - 112 mEq/L 103  CO2 Latest Ref Range: 19 - 32 mEq/L 26  Glucose Latest Ref Range: 70 - 99 mg/dL 87  BUN Latest Ref Range: 6 - 23 mg/dL 15  Creatinine Latest Ref Range: 0.40 - 1.20 mg/dL 08/14/2020  Calcium Latest Ref Range: 8.4 - 10.5 mg/dL 9.9  Alkaline Phosphatase Latest Ref Range: 39 - 117 U/L 99  Albumin Latest Ref Range: 3.5 - 5.2 g/dL 4.2  AST Latest Ref Range: 0 - 37 U/L 17  ALT Latest Ref Range: 0 - 35 U/L 13  Total Protein Latest Ref Range: 6.0 - 8.3 g/dL 7.5  Total Bilirubin Latest Ref Range: 0.2 - 1.2 mg/dL 0.5  GFR Latest Ref Range: >60.00 mL/min 60.83  WBC Latest Ref Range: 4.0 - 10.5 K/uL 7.4  RBC Latest Ref Range: 3.87 - 5.11 Mil/uL 4.38  Hemoglobin Latest Ref Range: 12.0 - 15.0 g/dL 8.34  HCT Latest Ref Range: 36 - 46 % 42.4  MCV Latest Ref Range: 78.0 - 100.0 fl 96.7  MCHC Latest Ref Range: 30.0 - 36.0 g/dL 19.6  RDW Latest Ref Range: 11.5 - 15.5 % 13.7  Platelets Latest Ref Range: 150 - 400 K/uL 238.0  Neutrophils Latest Ref Range: 43 - 77 % 53.2  Lymphocytes Latest Ref Range: 12 - 46 % 37.4  Monocytes Relative Latest Ref Range: 3 - 12 % 8.0  Eosinophil Latest Ref Range: 0 - 5 % 0.9  Basophil Latest Ref Range: 0 - 3 % 0.5  NEUT# Latest Ref Range: 1.4 - 7.7 K/uL 3.9  Lymphocyte # Latest Ref Range: 0.7 - 4.0 K/uL 2.8  Monocyte # Latest Ref Range: 0.1 - 1.0 K/uL 0.6  Eosinophils Absolute Latest Ref Range: 0 - 0 K/uL 0.1  Basophils Absolute Latest Ref Range: 0 - 0 K/uL 0.0  TSH Latest Ref Range: 0.35 - 4.50 uIU/mL 0.79  T4,Free(Direct) Latest Ref Range: 0.60 - 1.60 ng/dL 22.2   Thyroid Ultrasound 06/24/2020   Estimated total number of nodules >/= 1 cm: 5  Number of spongiform nodules >/=  2 cm not described below  (TR1): 0  Number of mixed cystic and solid nodules >/= 1.5 cm not described below (TR2): 0  _________________________________________________________  Nodule # 1:  Location: Isthmus; Inferior, left  Maximum size: 1.7 cm; Other 2 dimensions: 1.1 x 1.3 cm  Composition: cannot determine (2)  Echogenicity: cannot determine (1)  Shape: not taller-than-wide (0)  Margins: ill-defined (0)  Echogenic foci: peripheral calcifications (2)  ACR TI-RADS total points: 5.  ACR TI-RADS risk category: TR4 (4-6 points).  ACR TI-RADS recommendations:  **Given size (>/= 1.5 cm) and appearance, fine needle aspiration of this moderately suspicious nodule should be considered based on TI-RADS criteria.  _________________________________________________________  Nodule # 2:  Location: Right; Superior  Maximum size: 1.2 cm; Other 2 dimensions: 0.9 x 1.2 cm  Composition: solid/almost completely solid (2)  Echogenicity: isoechoic (1)  Shape: not taller-than-wide (0)  Margins: ill-defined (0)  Echogenic foci: peripheral calcifications (2)  ACR TI-RADS total points: 5.  ACR TI-RADS risk category: TR4 (4-6 points).  ACR TI-RADS recommendations:  *Given size (>/= 1 - 1.4 cm) and appearance, a follow-up ultrasound in 1 year should be considered based on TI-RADS criteria.  _________________________________________________________  Nodule # 3:  Location: Right; Mid  Maximum size: 2.8 cm; Other 2 dimensions: 2.6 x 1.3 cm  Composition: mixed cystic and solid (1)  Echogenicity: isoechoic (1)  Shape: not taller-than-wide (0)  Margins: smooth (0)  Echogenic foci: none (0)  ACR TI-RADS total points: 2.  ACR TI-RADS risk category: TR2 (2 points).  ACR TI-RADS recommendations:  This nodule does NOT meet TI-RADS criteria for biopsy or dedicated follow-up.  _________________________________________________________  Nodule #  4:  Location: Right; Inferior  Maximum size: 0.8 cm; Other 2 dimensions: 0.7 x 0.7 cm  Composition: solid/almost completely solid (2)  Echogenicity: hypoechoic (2)  Shape: not taller-than-wide (0)  Margins: ill-defined (0)  Echogenic foci: none (0)  ACR TI-RADS total points: 4.  ACR TI-RADS risk category: TR4 (4-6 points).  ACR TI-RADS recommendations:  Given size (<0.9 cm) and appearance, this nodule does NOT meet TI-RADS criteria for biopsy or dedicated follow-up.  _________________________________________________________  Nodule # 5:  Location: Left; Inferior  Maximum size: 1.9 cm; Other 2 dimensions: 1.4 x 1.5 cm  Composition: cannot determine (2)  Echogenicity: cannot determine (1)  Shape: not taller-than-wide (0)  Margins: ill-defined (0)  Echogenic foci: peripheral calcifications (2)  ACR TI-RADS total points: 5.  ACR TI-RADS risk category: TR4 (4-6 points).  ACR TI-RADS recommendations:  **Given size (>/= 1.5 cm) and appearance, fine needle aspiration of this moderately suspicious nodule should be considered based on TI-RADS criteria.  _________________________________________________________  Nodule # 6:  Location: Left; Inferior  Maximum size: 2.5 cm; Other 2 dimensions: 2.2 x 1.2 cm  Composition: mixed cystic and solid (1)  Echogenicity: cannot determine (1)  Shape: not taller-than-wide (0)  Margins: ill-defined (0)  Echogenic foci: none (0)  ACR TI-RADS total points: 2.  ACR TI-RADS risk category: TR2 (2 points).  ACR TI-RADS recommendations:  This nodule does NOT meet TI-RADS criteria for biopsy or dedicated follow-up.  _________________________________________________________  IMPRESSION: 1. Multiple thyroid nodules. 2. Nodule #1 and Nodule #5 are TR 4 nodules and both meet criteria for ultrasound-guided biopsy. Recommend correlation with prior biopsy reports if available. 3.  Nodule #2 in the right superior thyroid lobe meets criteria for 1 year follow-up.    FNA 07/23/2020 Clinical History: Isthmus; Inferior left 1.7 cm; Other 2 dimensions: 1.1  x 1.3 cm, TI-RADS total points 5  Specimen Submitted: A. THYROID GLAND, INFERIOR LEFT ISTHMUS, FINE  NEEDLE ASPIRATION:    FINAL MICROSCOPIC DIAGNOSIS:  - Scant follicular epithelium present (Bethesda category I)  FNA 07/23/2020 Clinical History: Left inferior 1.9 cm; Other 2 dimensions: 1.4 x 1.5  cm, TI-RADS total points 5  Specimen Submitted: A. THYROID GLAND, LEFT, LLP, FINE NEEDLE  ASPIRATION:    FINAL MICROSCOPIC DIAGNOSIS:  - Scant follicular epithelium present (Bethesda category I)   ASSESSMENT / PLAN / RECOMMENDATIONS:  1. Hyperthyroidism :  - She is clinically euthyroid  - No local neck symptoms  - Unable to proceed with thyroid uptake and scan in the past due to insurance issues.    Medications : Continue Methimazole 10 mg , half tablet daily    2.  MNG :  - No local neck symptoms.  - Prior FNA of the right nodule with benign cytology (02/2018)  - Scant cellularity of the left isthmic and left inferior nodules  - Previous records unavailable  - Will proceed with repeat FNA     F/U in 4 months    Signed electronically by: Lyndle Herrlich, MD  [facility]  [facility] [address] [place] [place], [place] [address] Phone: [phone] FAX: [phone]      CC: Julie Gutierrez [address] [place] [address] Phone: [phone]  Fax: [phone]   Return to Endocrinology clinic as below: Future Appointments  Date Time Provider Department Center  04/23/2021 10:50 AM Ayako Tapanes, Konrad Dolores, MD [id number] None

## 2020-10-22 ENCOUNTER — Encounter: Payer: Self-pay | Admitting: Internal Medicine

## 2020-10-22 MED ORDER — METHIMAZOLE 5 MG PO TABS: 5.0000 mg | ORAL_TABLET | Freq: Every day | ORAL | 3 refills | Status: DC

## 2020-10-22 NOTE — Addendum Note (Signed)
Addended by: Scarlette Shorts on: 10/22/2020 01:57 PM   Modules accepted: Orders

## 2020-10-30 ENCOUNTER — Other Ambulatory Visit (HOSPITAL_COMMUNITY)
Admission: RE | Admit: 2020-10-30 | Discharge: 2020-10-30 | Disposition: A | Discharge status: Home / Self Care | Payer: Medicaid Other | Source: Ambulatory Visit | Attending: Radiology | Admitting: Radiology

## 2020-10-30 ENCOUNTER — Ambulatory Visit
Admission: RE | Admit: 2020-10-30 | Discharge: 2020-10-30 | Disposition: A | Discharge status: Home / Self Care | Payer: Medicaid Other | Source: Ambulatory Visit | Attending: Internal Medicine | Admitting: Internal Medicine

## 2020-10-30 DIAGNOSIS — E042 Nontoxic multinodular goiter: Secondary | ICD-10-CM

## 2020-10-31 LAB — CYTOLOGY - NON PAP

## 2020-11-01 ENCOUNTER — Telehealth: Payer: Self-pay | Admitting: Internal Medicine

## 2020-11-01 NOTE — Telephone Encounter (Signed)
Discussed FNA results with the pt as below   Clinical History: TI-RADS total points 5  Specimen Submitted: A. 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 THYROID GLAND, LEFT, LLP - NODULE # 5, FINE  NEEDLE ASPIRATION:    FINAL MICROSCOPIC DIAGNOSIS:  - Nondiagnostic material   This would be the second attemp. No attempt will be made . Pt will need to decide on Sx vs monitoring   But will wait on Afirma first for now    Abby Raelyn Mora, MD  [facility] End[facility]  [facility] [address] [place], [place] [address] Phone: [phone] FAX: [phone]

## 2020-11-20 ENCOUNTER — Encounter: Payer: Self-pay | Admitting: Internal Medicine

## 2020-11-26 ENCOUNTER — Encounter (HOSPITAL_COMMUNITY): Payer: Self-pay

## 2021-02-20 IMAGING — US US THYROID
1 series · 12 of 25 positions shown · non-contrast
Comparison: None.

CLINICAL DATA: History of left thyroid nodule. Reportedly, patient
had a benign biopsy in 0947.

EXAM:
THYROID ULTRASOUND
TECHNIQUE: Ultrasound examination of the thyroid gland and adjacent soft
tissues was performed.

[Series 1: us thyroid · 0.06mm/px · 12 of 52 slices shown]
[im 3/52]
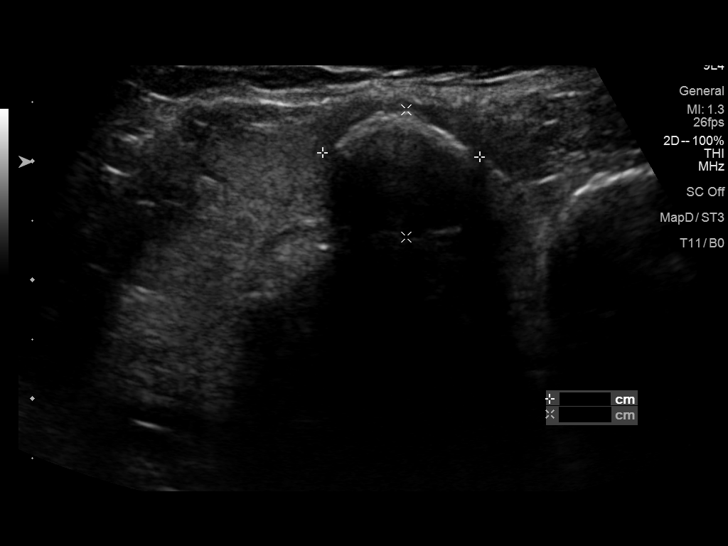
[im 7/52]
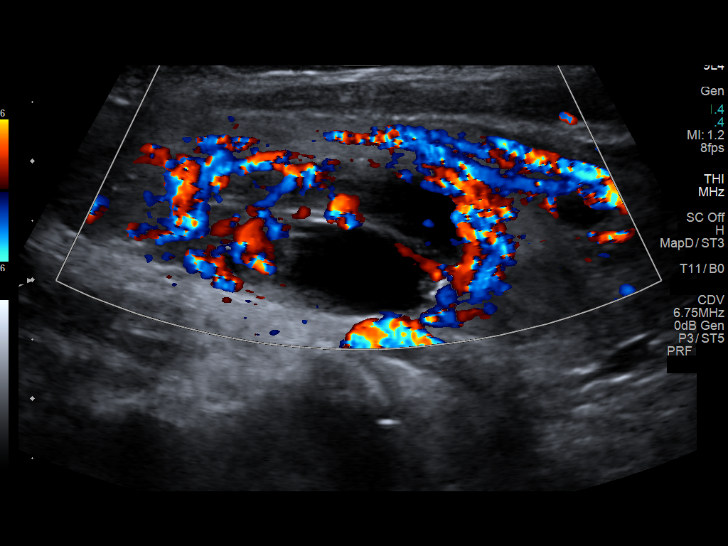
[im 11/52]
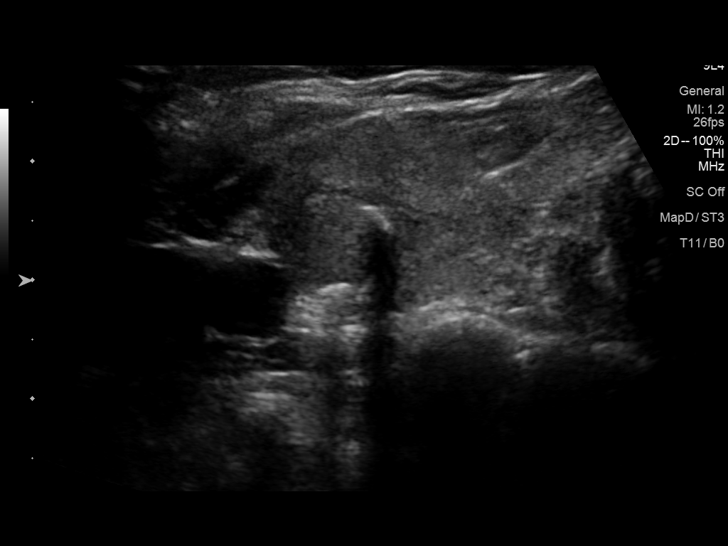
[im 15/52]
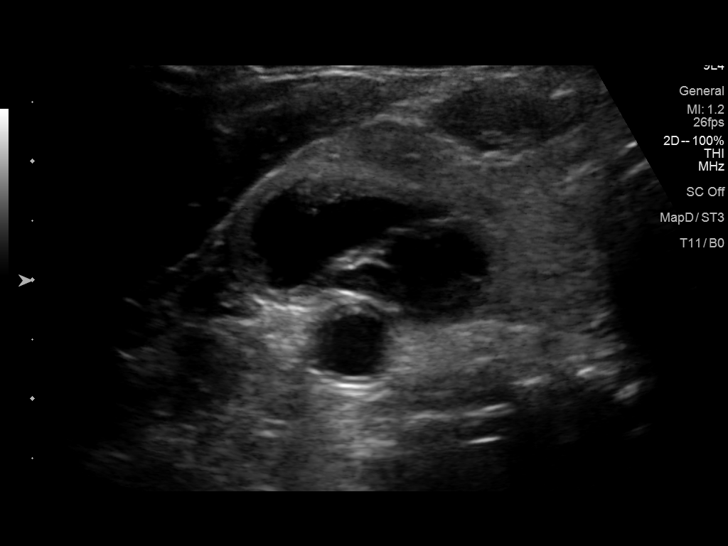
[im 20/52]
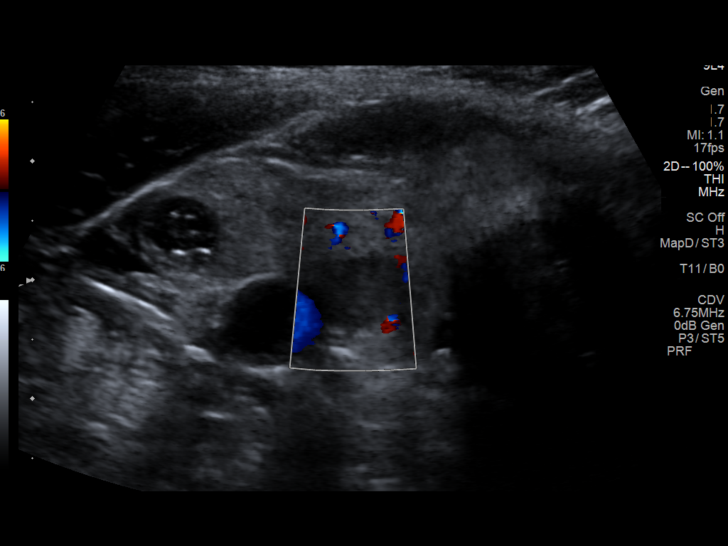
[im 24/52]
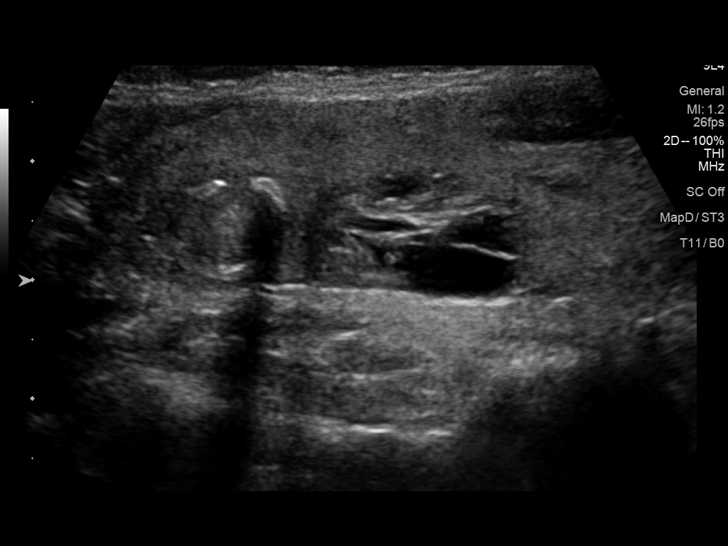
[im 28/52]
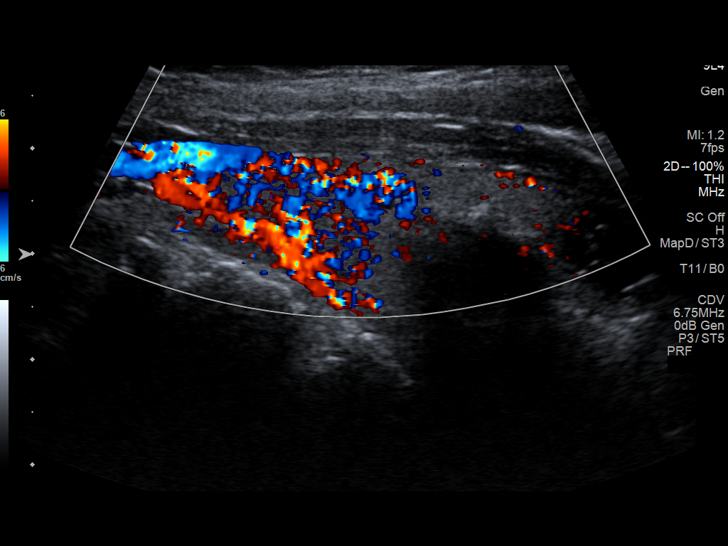
[im 32/52]
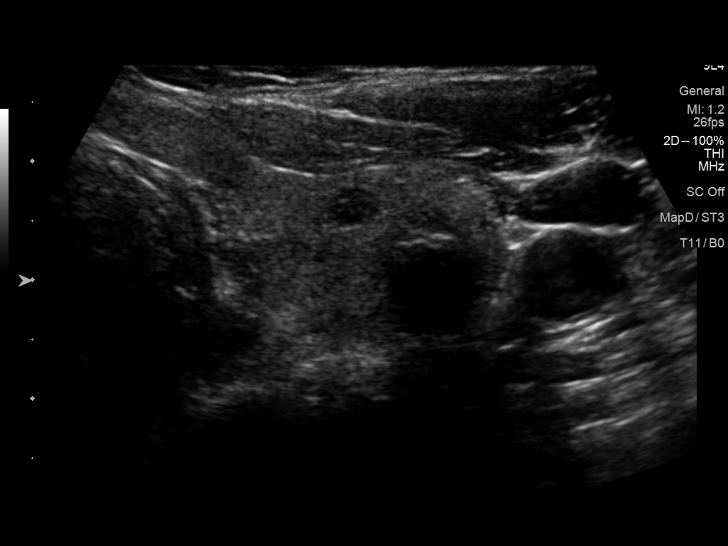
[im 37/52]
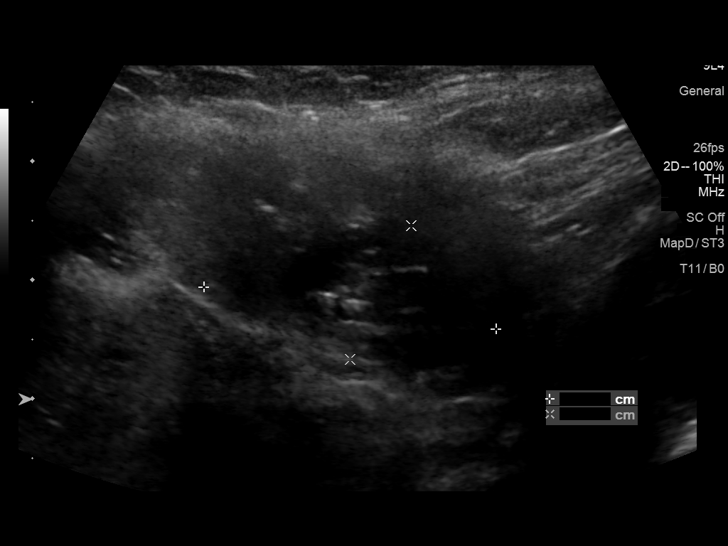
[im 41/52]
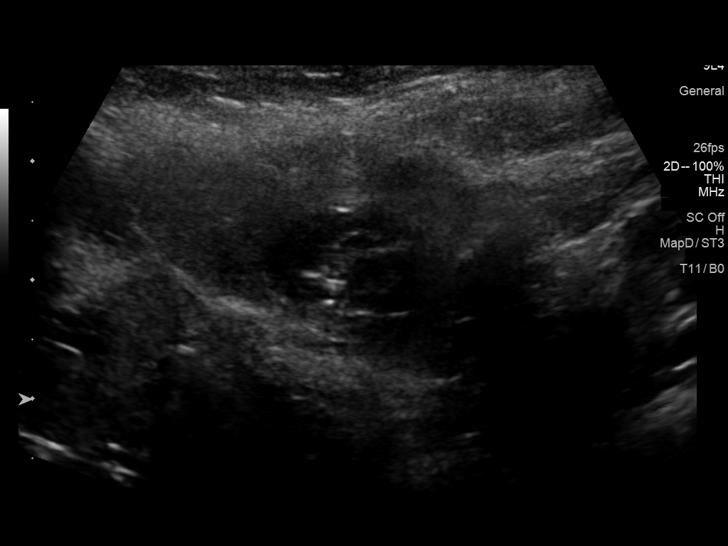
[im 45/52]
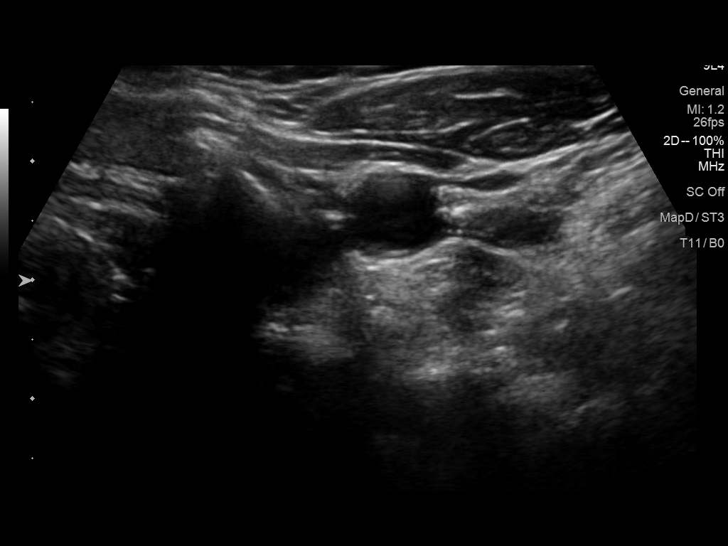
[im 49/52]
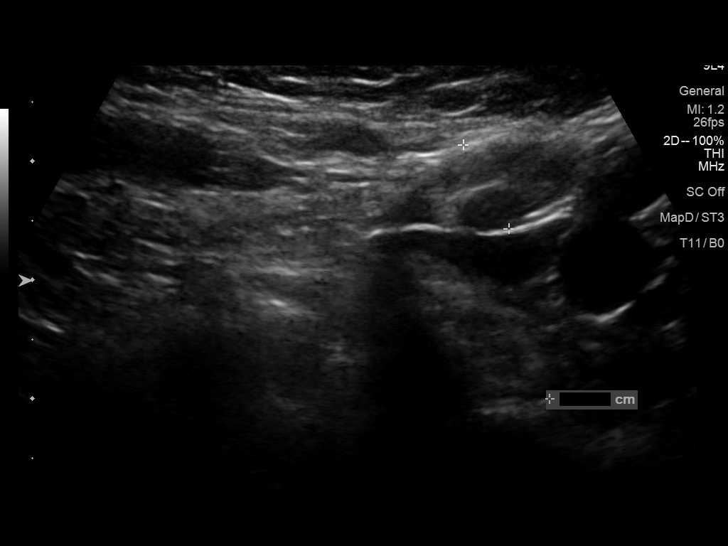

[12 of 25 positions shown; findings below may reference images not displayed]

FINDINGS: Parenchymal Echotexture: Mildly heterogenous

Isthmus: 0.5 cm

Right lobe: 6.3 x 2.3 x 3.4 cm

Left lobe: 4.8 x 1.9 x 2.7 cm

_________________________________________________________

Estimated total number of nodules >/= 1 cm: 5

Number of spongiform nodules >/=  2 cm not described below (TR1): 0

Number of mixed cystic and solid nodules >/= 1.5 cm not described
below (TR2): 0

_________________________________________________________

Nodule # 1:

Location: Isthmus; Inferior, left

Maximum size: 1.7 cm; Other 2 dimensions: 1.1 x 1.3 cm

Composition: cannot determine (2)

Echogenicity: cannot determine (1)

Shape: not taller-than-wide (0)

Margins: ill-defined (0)

Echogenic foci: peripheral calcifications (2)

ACR TI-RADS total points: 5.

ACR TI-RADS risk category: TR4 (4-6 points).

ACR TI-RADS recommendations:

**Given size (>/= 1.5 cm) and appearance, fine needle aspiration of
this moderately suspicious nodule should be considered based on
TI-RADS criteria.

_________________________________________________________

Nodule # 2:

Location: Right; Superior

Maximum size: 1.2 cm; Other 2 dimensions: 0.9 x 1.2 cm

Composition: solid/almost completely solid (2)

Echogenicity: isoechoic (1)

Shape: not taller-than-wide (0)

Margins: ill-defined (0)

Echogenic foci: peripheral calcifications (2)

ACR TI-RADS total points: 5.

ACR TI-RADS risk category: TR4 (4-6 points).

ACR TI-RADS recommendations:

*Given size (>/= 1 - 1.4 cm) and appearance, a follow-up ultrasound
in 1 year should be considered based on TI-RADS criteria.

_________________________________________________________

Nodule # 3:

Location: Right; Mid

Maximum size: 2.8 cm; Other 2 dimensions: 2.6 x 1.3 cm

Composition: mixed cystic and solid (1)

Echogenicity: isoechoic (1)

Shape: not taller-than-wide (0)

Margins: smooth (0)

Echogenic foci: none (0)

ACR TI-RADS total points: 2.

ACR TI-RADS risk category: TR2 (2 points).

ACR TI-RADS recommendations:

This nodule does NOT meet TI-RADS criteria for biopsy or dedicated
follow-up.

_________________________________________________________

Nodule # 4:

Location: Right; Inferior

Maximum size: 0.8 cm; Other 2 dimensions: 0.7 x 0.7 cm

Composition: solid/almost completely solid (2)

Echogenicity: hypoechoic (2)

Shape: not taller-than-wide (0)

Margins: ill-defined (0)

Echogenic foci: none (0)

ACR TI-RADS total points: 4.

ACR TI-RADS risk category: TR4 (4-6 points).

ACR TI-RADS recommendations:

Given size (<0.9 cm) and appearance, this nodule does NOT meet
TI-RADS criteria for biopsy or dedicated follow-up.

_________________________________________________________

Nodule # 5:

Location: Left; Inferior

Maximum size: 1.9 cm; Other 2 dimensions: 1.4 x 1.5 cm

Composition: cannot determine (2)

Echogenicity: cannot determine (1)

Shape: not taller-than-wide (0)

Margins: ill-defined (0)

Echogenic foci: peripheral calcifications (2)

ACR TI-RADS total points: 5.

ACR TI-RADS risk category: TR4 (4-6 points).

ACR TI-RADS recommendations:

**Given size (>/= 1.5 cm) and appearance, fine needle aspiration of
this moderately suspicious nodule should be considered based on
TI-RADS criteria.

_________________________________________________________

Nodule # 6:

Location: Left; Inferior

Maximum size: 2.5 cm; Other 2 dimensions: 2.2 x 1.2 cm

Composition: mixed cystic and solid (1)

Echogenicity: cannot determine (1)

Shape: not taller-than-wide (0)

Margins: ill-defined (0)

Echogenic foci: none (0)

ACR TI-RADS total points: 2.

ACR TI-RADS risk category: TR2 (2 points).

ACR TI-RADS recommendations:

This nodule does NOT meet TI-RADS criteria for biopsy or dedicated
follow-up.

_________________________________________________________
IMPRESSION: 1. Multiple thyroid nodules.
2. Nodule #1 and Nodule #5 are TR 4 nodules and both meet criteria
for ultrasound-guided biopsy. Recommend correlation with prior
biopsy reports if available.
3. Nodule #2 in the right superior thyroid lobe meets criteria for 1
year follow-up.

The above is in keeping with the ACR TI-RADS recommendations - [HOSPITAL] 1177;[DATE].

## 2021-03-21 IMAGING — US US FNA BIOPSY THYROID 1ST LESION
1 series · 13 of 18 positions shown · non-contrast
Comparison: Thyroid ultrasound performed 06/24/2020

MEDICATIONS:
1% plain lidocaine, 1 mL

COMPLICATIONS:
None immediate.

INDICATION: Indeterminate thyroid isthmus and left inferior nodules

EXAM:
ULTRASOUND GUIDED FINE NEEDLE ASPIRATION OF INDETERMINATE THYROID
ISTHMUS AND LEFT INFERIOR NODULES
TECHNIQUE: Informed written consent was obtained from the patient after a
discussion of the risks, benefits and alternatives to treatment.
Questions regarding the procedure were encouraged and answered. A
timeout was performed prior to the initiation of the procedure.

[Series 1: us fna biopsy thyroid 1st lesion · 0.06mm/px · 18 acquisitions, 13 frames shown]
[im 1/18]
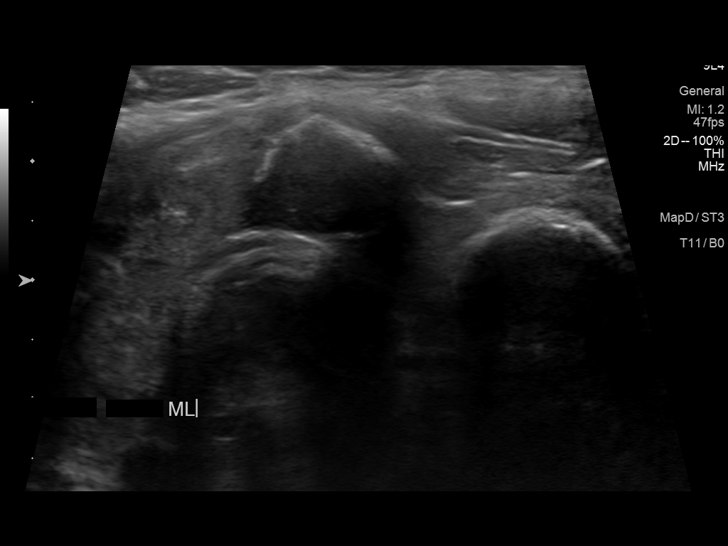
[im 3/18]
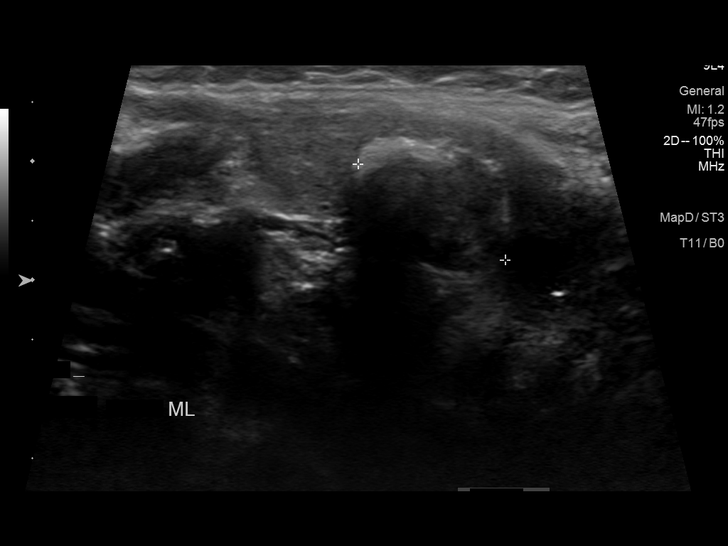
[im 4/18]
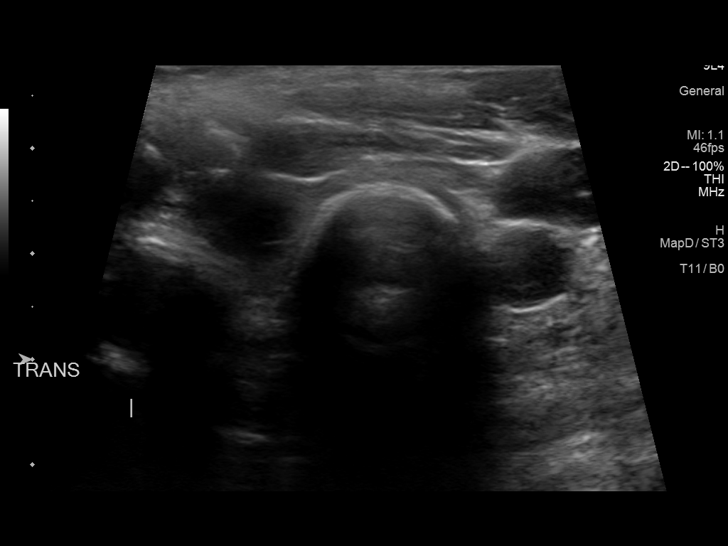
[im 5/18]
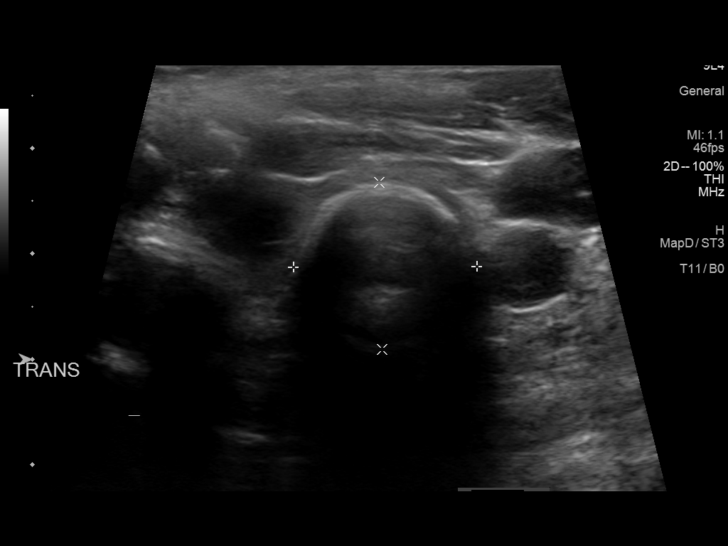
[im 7/18]
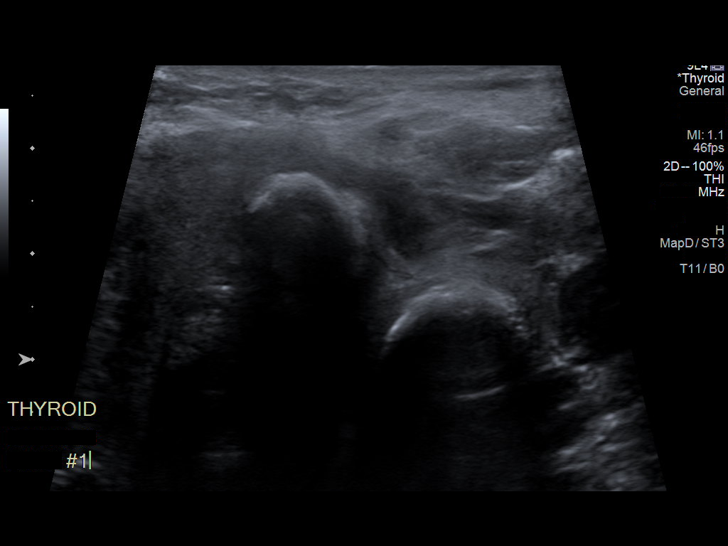
[im 8/18]
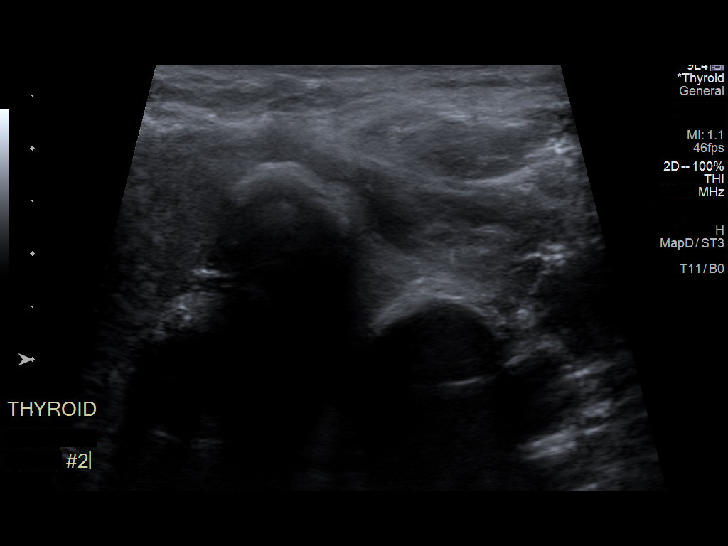
[im 10/18]
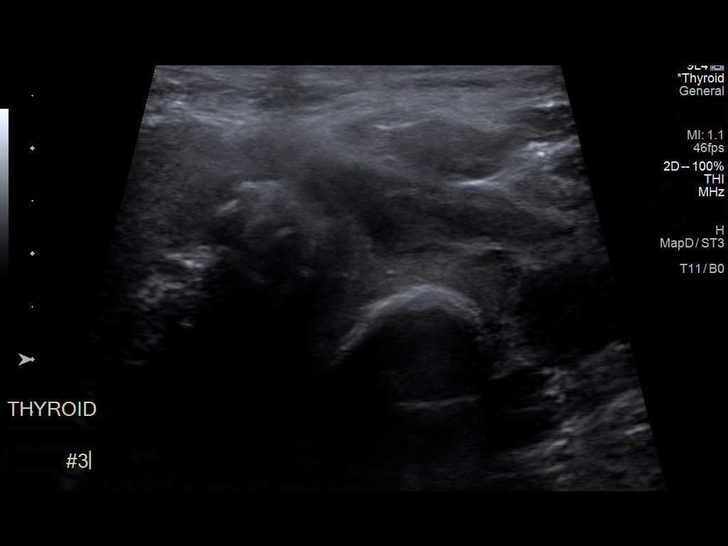
[im 11/18]
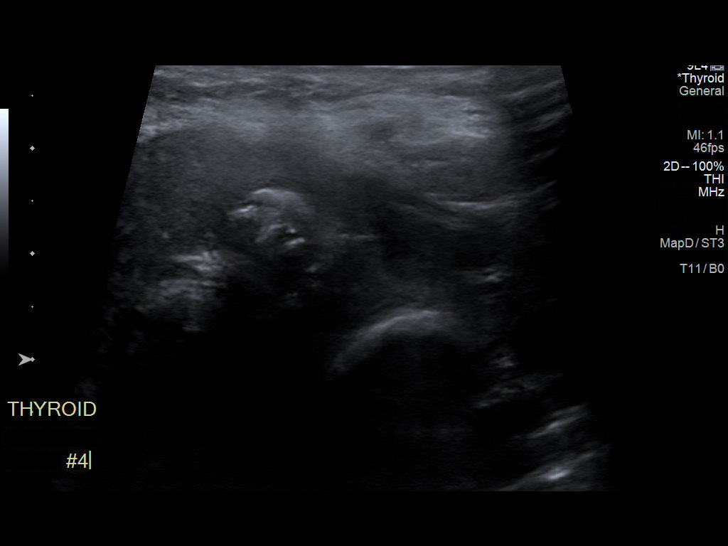
[im 12/18]
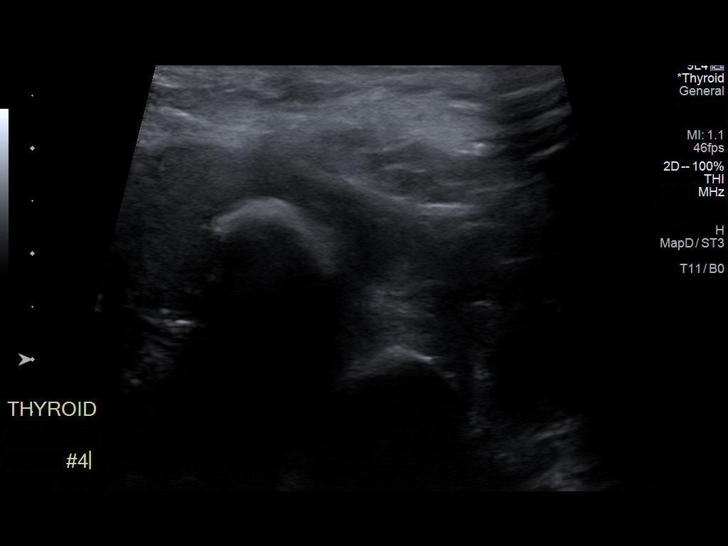
[im 14/18]
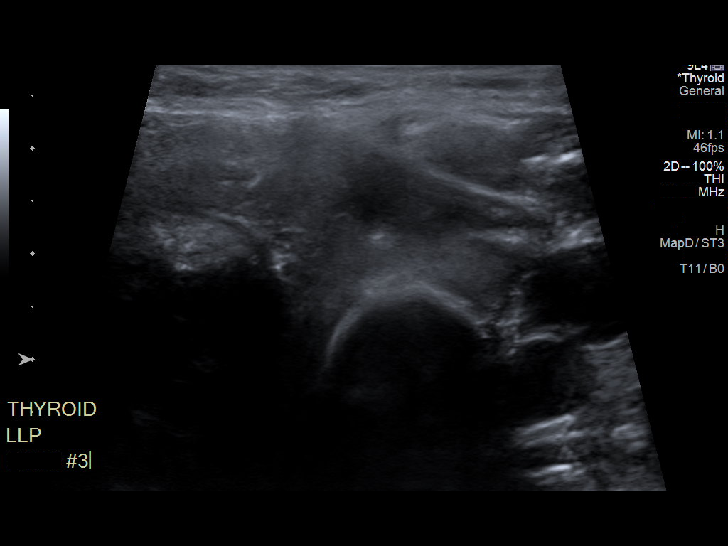
[im 15/18]
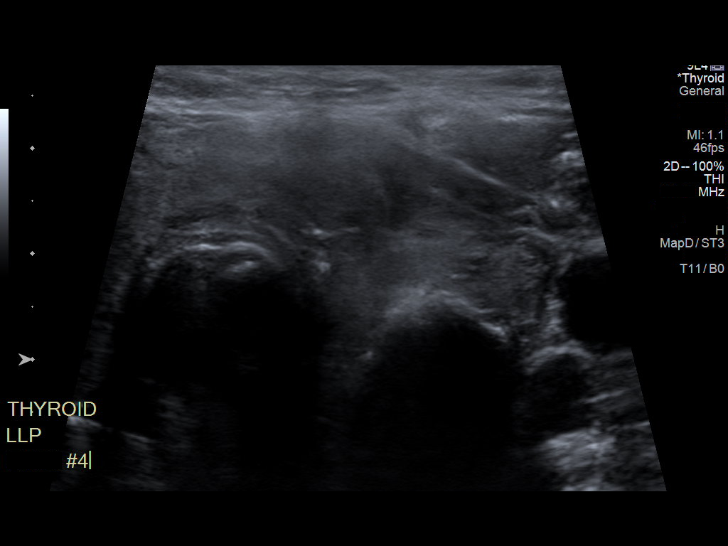
[im 16/18]
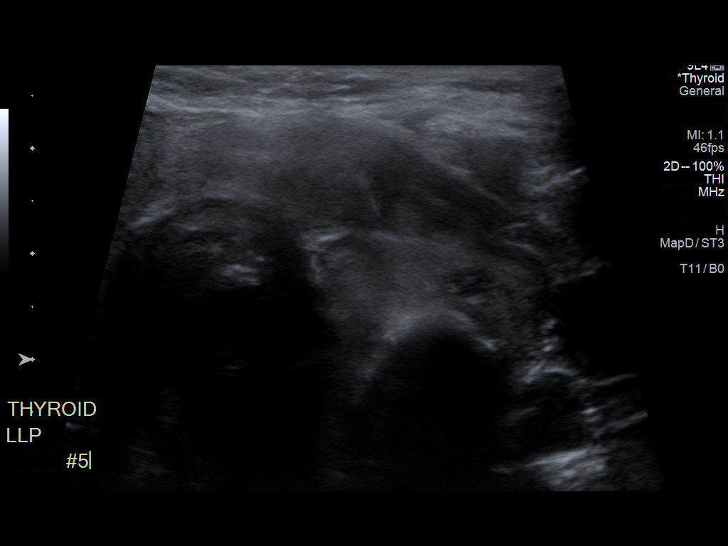
[im 18/18]
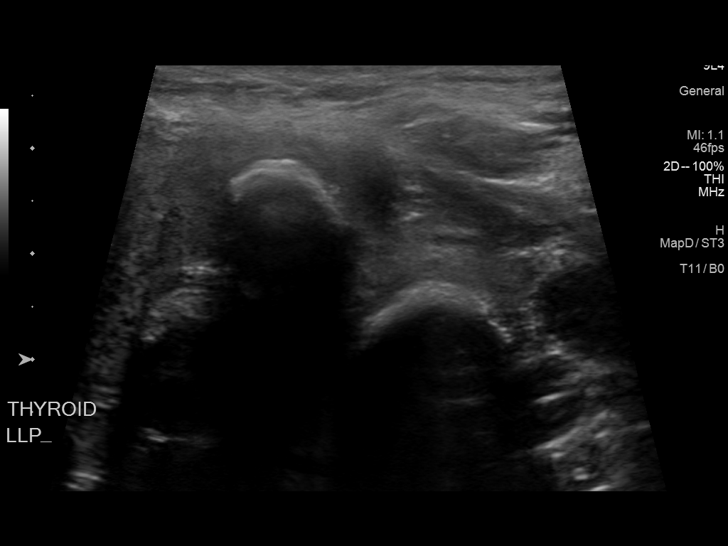

[13 of 18 positions shown; findings below may reference images not displayed]

Pre-procedural ultrasound scanning demonstrated unchanged size and
appearance of the indeterminate nodules within the thyroid isthmus
and left thyroid lobe.

The procedure was planned. The neck was prepped in the usual sterile
fashion, and a sterile drape was applied covering the operative
field. A timeout was performed prior to the initiation of the
procedure. Local anesthesia was provided with 1% lidocaine.

Under direct ultrasound guidance, 5 FNA biopsies were performed of
the thyroid isthmus nodule with a 25 gauge needle. Nodule is heavily
calcified. Multiple ultrasound images were saved for procedural
documentation purposes. The samples were prepared and submitted to
pathology.

Under direct ultrasound guidance, 5 FNA biopsies were performed of
the left inferior thyroid nodule with a 25 gauge needle. Nodule is
heavily calcified. Multiple ultrasound images were saved for
procedural documentation purposes. The samples were prepared and
submitted to pathology.

Limited post procedural scanning was negative for hematoma or
additional complication. Dressings were placed. The patient
tolerated the above procedures procedure well without immediate
postprocedural complication.
FINDINGS: FINDINGS
Nodule reference number based on prior diagnostic ultrasound: 1

Maximum size: 1.7 cm

Location: Isthmus  ;  Inferior

ACR TI-RADS total points: 5

ACR TI-RADS risk category:   TR4 (4-6 points)

Prior biopsy:  No

Reason for biopsy: meets ACR TI-RADS criteria

_________________________________________________________

Nodule reference number based on prior diagnostic ultrasound: 5

Maximum size: 1.9 cm

Location: Left  ;  Inferior

ACR TI-RADS total points: 5

ACR TI-RADS risk category:   TR4 (4-6 points)

Prior biopsy:  No

Reason for biopsy: meets ACR TI-RADS criteria

Ultrasound imaging confirms appropriate placement of the needles
within the thyroid nodule.
IMPRESSION: 1. Technically successful ultrasound guided fine needle aspiration
of thyroid isthmus nodule as described above.
2. Technically successful ultrasound guided fine needle aspiration
of left inferior thyroid nodule as described above.

## 2021-04-23 ENCOUNTER — Ambulatory Visit: Payer: Medicaid Other | Admitting: Internal Medicine

## 2021-04-23 ENCOUNTER — Other Ambulatory Visit: Payer: Self-pay

## 2021-04-23 ENCOUNTER — Encounter: Payer: Self-pay | Admitting: Internal Medicine

## 2021-04-23 VITALS — BP 118/68 | HR 69 | Ht 60.0 in | Wt 148.0 lb

## 2021-04-23 DIAGNOSIS — E042 Nontoxic multinodular goiter: Secondary | ICD-10-CM

## 2021-04-23 DIAGNOSIS — E0590 Thyrotoxicosis, unspecified without thyrotoxic crisis or storm: Secondary | ICD-10-CM | POA: Diagnosis not present

## 2021-04-23 LAB — COMPREHENSIVE METABOLIC PANEL
ALT: 13 U/L (ref 0–35)
AST: 17 U/L (ref 0–37)
Albumin: 4.3 g/dL (ref 3.5–5.2)
Alkaline Phosphatase: 116 U/L (ref 39–117)
BUN: 12 mg/dL (ref 6–23)
CO2: 26 mEq/L (ref 19–32)
Calcium: 10.2 mg/dL (ref 8.4–10.5)
Chloride: 102 mEq/L (ref 96–112)
Creatinine, Ser: 1.11 mg/dL (ref 0.40–1.20)
GFR: 54.23 mL/min — ABNORMAL LOW (ref >60.00)
Glucose, Bld: 93 mg/dL (ref 70–99)
Potassium: 3.5 mEq/L (ref 3.5–5.1)
Sodium: 138 mEq/L (ref 135–145)
Total Bilirubin: 0.6 mg/dL (ref 0.2–1.2)
Total Protein: 7.6 g/dL (ref 6.0–8.3)

## 2021-04-23 LAB — CBC WITH DIFFERENTIAL/PLATELET
Basophils Absolute: 0.1 10*3/uL (ref 0.0–0.1)
Basophils Relative: 0.9 % (ref 0.0–3.0)
Eosinophils Absolute: 0.1 10*3/uL (ref 0.0–0.7)
Eosinophils Relative: 1.2 % (ref 0.0–5.0)
HCT: 44.1 % (ref 36.0–46.0)
Hemoglobin: 14.8 g/dL (ref 12.0–15.0)
Lymphocytes Relative: 45.1 % (ref 12.0–46.0)
Lymphs Abs: 2.8 10*3/uL (ref 0.7–4.0)
MCHC: 33.7 g/dL (ref 30.0–36.0)
MCV: 96.4 fl (ref 78.0–100.0)
Monocytes Absolute: 0.5 10*3/uL (ref 0.1–1.0)
Monocytes Relative: 8.4 % (ref 3.0–12.0)
Neutro Abs: 2.8 10*3/uL (ref 1.4–7.7)
Neutrophils Relative %: 44.4 % (ref 43.0–77.0)
Platelets: 216 10*3/uL (ref 150.0–400.0)
RBC: 4.57 Mil/uL (ref 3.87–5.11)
RDW: 13.5 % (ref 11.5–15.5)
WBC: 6.2 10*3/uL (ref 4.0–10.5)

## 2021-04-23 LAB — T4, FREE: Free T4: 0.71 ng/dL (ref 0.60–1.60)

## 2021-04-23 LAB — TSH: TSH: 2.03 u[IU]/mL (ref 0.35–5.50)

## 2021-04-23 NOTE — Progress Notes (Signed)
Name: Julie Gutierrez  MRN/ DOB: [id number], 1961/02/04    Age/ Sex: 60 y.o., female     PCP: Shifflett, Carlis StableJeremy, PA-C   Reason for Endocrinology Evaluation: Hyperthyroidism     Initial Endocrinology Clinic Visit: 05/31/2019    PATIENT IDENTIFIER: Julie Gutierrez is a 60 y.o., female with a past medical history of HTN and Dyslipidemia. She has followed with [facility] since 05/31/2019 for consultative assistance with management of her hyperthyroidism.   HISTORICAL SUMMARY: The patient has been diagnosed with right thyroid nodule in her 30's. She recalls having a thyroid uptake and scan at the time as well as a biopsy with benign findings per pt. In 2019 she was started on Methimazole due to thyroid overactivity.  She also had a right nodule FNA on 02/23/2018 with benign cytology per patient. (Previous endocrinology notes not available)   Se is S/P FNA of the two left inferior nodules ( 1.9 cm and 1.7 cm )  With scan cellularity (07/2020) Repeat FNA in 10/2020 showed Atypia of the left isthmic nodule with benign Afirma and scant cellularity of the left inferior nodule     Sister with thyroid nodules  SUBJECTIVE:    Today (04/23/2021):  Julie Gutierrez is here for a  follow up on hyperthyroidism.     She has been compliant with methimazole 5 mg daily  Has occasional  diarrhea  that she attributes to metformin and fruits  Denies palpitations  Loss brother birthday was 6/18 which was hard for her this yr Denies local neck symptoms    HISTORY:  Past Medical History: No past medical history on file. Past Surgical History: The histories are not reviewed yet. Please review them in the "History" navigator section and refresh this SmartLink. Social History:  reports that she has been smoking. She has never used smokeless tobacco. She reports that she does not drink alcohol and does not use drugs. Family History:  Family History  Problem Relation Age of Onset   Bladder  Cancer Mother    Diabetes Father    Goiter Sister      HOME MEDICATIONS: Allergies as of 04/23/2021       Reactions   Prednisone Other (See Comments)   hallucinations   Sulfacetamide Hives        Medication List        Accurate as of April 23, 2021 10:52 AM. If you have any questions, ask your nurse or doctor.          amLODipine 10 MG tablet Commonly known as: NORVASC Take by mouth.   atenolol 50 MG tablet Commonly known as: TENORMIN Take by mouth.   atorvastatin 20 MG tablet Commonly known as: LIPITOR Take 20 mg by mouth at bedtime.   escitalopram 5 MG tablet Commonly known as: LEXAPRO Take by mouth.   fexofenadine 60 MG tablet Commonly known as: ALLEGRA Take by mouth.   fluticasone 50 MCG/ACT nasal spray Commonly known as: FLONASE Place into the nose.   meclizine 25 MG tablet Commonly known as: ANTIVERT Take by mouth.   METFORMIN HCL PO Take 1,000 mg by mouth. Takes half a tablet daily, then another half as needed   methimazole 5 MG tablet Commonly known as: TAPAZOLE Take 1 tablet (5 mg total) by mouth daily.   multivitamin-iron-minerals-folic acid chewable tablet Chew 1 tablet by mouth daily.   omeprazole 20 MG capsule Commonly known as: PRILOSEC Take by mouth.   triamterene-hydrochlorothiazide 37.5-25 MG tablet Commonly known as: [employer]IDE-25  Take 1 tablet by mouth daily.          OBJECTIVE:   PHYSICAL EXAM: VS: BP 118/68 (BP Location: Left Arm, Patient Position: Sitting, Cuff Size: Normal)   Pulse 69   Ht 5' (1.524 m)   Wt 148 lb (67.1 kg)   SpO2 98%   BMI 28.90 kg/m    EXAM: General: Pt appears well and is in NAD  Neck: General: Supple without adenopathy. Thyroid: Thyroid size normal.left isthmic nodule ~ 1.5 cm   Lungs: Clear with good BS bilat with no rales, rhonchi, or wheezes  Heart: Auscultation: RRR.  Abdomen: Normoactive bowel sounds, soft, nontender, without masses or organomegaly palpable  Extremities: BL  LE: No pretibial edema normal ROM and strength.  Mental Status: Judgment, insight: Intact Orientation: Oriented to time, place, and person Mood and affect: No depression, anxiety, or agitation     DATA REVIEWED:    Results for Julie Gutierrez (MRN [id number]) as of 04/24/2021 20:08  Ref. Range 04/23/2021 11:23  Sodium Latest Ref Range: 135 - 145 mEq/L 138  Potassium Latest Ref Range: 3.5 - 5.1 mEq/L 3.5  Chloride Latest Ref Range: 96 - 112 mEq/L 102  CO2 Latest Ref Range: 19 - 32 mEq/L 26  Glucose Latest Ref Range: 70 - 99 mg/dL 93  BUN Latest Ref Range: 6 - 23 mg/dL 12  Creatinine Latest Ref Range: 0.40 - 1.20 mg/dL 4.58  Calcium Latest Ref Range: 8.4 - 10.5 mg/dL 09.9  Alkaline Phosphatase Latest Ref Range: 39 - 117 U/L 116  Albumin Latest Ref Range: 3.5 - 5.2 g/dL 4.3  AST Latest Ref Range: 0 - 37 U/L 17  ALT Latest Ref Range: 0 - 35 U/L 13  Total Protein Latest Ref Range: 6.0 - 8.3 g/dL 7.6  Total Bilirubin Latest Ref Range: 0.2 - 1.2 mg/dL 0.6  GFR Latest Ref Range: >60.00 mL/min 54.23 (L)  WBC Latest Ref Range: 4.0 - 10.5 K/uL 6.2  RBC Latest Ref Range: 3.87 - 5.11 Mil/uL 4.57  Hemoglobin Latest Ref Range: 12.0 - 15.0 g/dL 83.3  HCT Latest Ref Range: 36.0 - 46.0 % 44.1  MCV Latest Ref Range: 78.0 - 100.0 fl 96.4  MCHC Latest Ref Range: 30.0 - 36.0 g/dL 82.5  RDW Latest Ref Range: 11.5 - 15.5 % 13.5  Platelets Latest Ref Range: 150.0 - 400.0 K/uL 216.0  Neutrophils Latest Ref Range: 43.0 - 77.0 % 44.4  Lymphocytes Latest Ref Range: 12.0 - 46.0 % 45.1  Monocytes Relative Latest Ref Range: 3.0 - 12.0 % 8.4  Eosinophil Latest Ref Range: 0.0 - 5.0 % 1.2  Basophil Latest Ref Range: 0.0 - 3.0 % 0.9  NEUT# Latest Ref Range: 1.4 - 7.7 K/uL 2.8  Lymphocyte # Latest Ref Range: 0.7 - 4.0 K/uL 2.8  Monocyte # Latest Ref Range: 0.1 - 1.0 K/uL 0.5  Eosinophils Absolute Latest Ref Range: 0.0 - 0.7 K/uL 0.1  Basophils Absolute Latest Ref Range: 0.0 - 0.1 K/uL 0.1  TSH Latest Ref  Range: 0.35 - 5.50 uIU/mL 2.03  T4,Free(Direct) Latest Ref Range: 0.60 - 1.60 ng/dL 0.53      Thyroid Ultrasound 06/24/2020   Estimated total number of nodules >/= 1 cm: 5   Number of spongiform nodules >/=  2 cm not described below (TR1): 0   Number of mixed cystic and solid nodules >/= 1.5 cm not described below (TR2): 0   _________________________________________________________   Nodule # 1:   Location: Isthmus; Inferior, left   Maximum size:  1.7 cm; Other 2 dimensions: 1.1 x 1.3 cm   Composition: cannot determine (2)   Echogenicity: cannot determine (1)   Shape: not taller-than-wide (0)   Margins: ill-defined (0)   Echogenic foci: peripheral calcifications (2)   ACR TI-RADS total points: 5.   ACR TI-RADS risk category: TR4 (4-6 points).   ACR TI-RADS recommendations:   **Given size (>/= 1.5 cm) and appearance, fine needle aspiration of this moderately suspicious nodule should be considered based on TI-RADS criteria.   _________________________________________________________   Nodule # 2:   Location: Right; Superior   Maximum size: 1.2 cm; Other 2 dimensions: 0.9 x 1.2 cm   Composition: solid/almost completely solid (2)   Echogenicity: isoechoic (1)   Shape: not taller-than-wide (0)   Margins: ill-defined (0)   Echogenic foci: peripheral calcifications (2)   ACR TI-RADS total points: 5.   ACR TI-RADS risk category: TR4 (4-6 points).   ACR TI-RADS recommendations:   *Given size (>/= 1 - 1.4 cm) and appearance, a follow-up ultrasound in 1 year should be considered based on TI-RADS criteria.   _________________________________________________________   Nodule # 3:   Location: Right; Mid   Maximum size: 2.8 cm; Other 2 dimensions: 2.6 x 1.3 cm   Composition: mixed cystic and solid (1)   Echogenicity: isoechoic (1)   Shape: not taller-than-wide (0)   Margins: smooth (0)   Echogenic foci: none (0)   ACR TI-RADS total points: 2.    ACR TI-RADS risk category: TR2 (2 points).   ACR TI-RADS recommendations:   This nodule does NOT meet TI-RADS criteria for biopsy or dedicated follow-up.   _________________________________________________________   Nodule # 4:   Location: Right; Inferior   Maximum size: 0.8 cm; Other 2 dimensions: 0.7 x 0.7 cm   Composition: solid/almost completely solid (2)   Echogenicity: hypoechoic (2)   Shape: not taller-than-wide (0)   Margins: ill-defined (0)   Echogenic foci: none (0)   ACR TI-RADS total points: 4.   ACR TI-RADS risk category: TR4 (4-6 points).   ACR TI-RADS recommendations:   Given size (<0.9 cm) and appearance, this nodule does NOT meet TI-RADS criteria for biopsy or dedicated follow-up.   _________________________________________________________   Nodule # 5:   Location: Left; Inferior   Maximum size: 1.9 cm; Other 2 dimensions: 1.4 x 1.5 cm   Composition: cannot determine (2)   Echogenicity: cannot determine (1)   Shape: not taller-than-wide (0)   Margins: ill-defined (0)   Echogenic foci: peripheral calcifications (2)   ACR TI-RADS total points: 5.   ACR TI-RADS risk category: TR4 (4-6 points).   ACR TI-RADS recommendations:   **Given size (>/= 1.5 cm) and appearance, fine needle aspiration of this moderately suspicious nodule should be considered based on TI-RADS criteria.   _________________________________________________________   Nodule # 6:   Location: Left; Inferior   Maximum size: 2.5 cm; Other 2 dimensions: 2.2 x 1.2 cm   Composition: mixed cystic and solid (1)   Echogenicity: cannot determine (1)   Shape: not taller-than-wide (0)   Margins: ill-defined (0)   Echogenic foci: none (0)   ACR TI-RADS total points: 2.   ACR TI-RADS risk category: TR2 (2 points).   ACR TI-RADS recommendations:   This nodule does NOT meet TI-RADS criteria for biopsy or dedicated follow-up.    _________________________________________________________   IMPRESSION: 1. Multiple thyroid nodules. 2. Nodule #1 and Nodule #5 are TR 4 nodules and both meet criteria for ultrasound-guided biopsy. Recommend correlation with prior biopsy reports  if available. 3. Nodule #2 in the right superior thyroid lobe meets criteria for 1 year follow-up.    FNA 07/23/2020 Clinical History: Isthmus; Inferior left 1.7 cm; Other 2 dimensions: 1.1  x 1.3 cm, TI-RADS total points 5  Specimen Submitted:  A. THYROID GLAND, INFERIOR LEFT ISTHMUS, FINE  NEEDLE ASPIRATION:    FINAL MICROSCOPIC DIAGNOSIS:  - Scant follicular epithelium present (Bethesda category I)  FNA left isthmic inferior 10/30/2020 Specimen Submitted:  A. THYROID GLAND, INFERIOR ISTHMUS LT OF ML -  NODULE #1, FINE NEEDLE ASPIRATION:   FINAL MICROSCOPIC DIAGNOSIS:  - Atypia of undetermined significance (Bethesda category III)   Afirma benign    FNA 07/23/2020 Clinical History: Left inferior 1.9 cm; Other 2 dimensions: 1.4 x 1.5  cm, TI-RADS total points 5  Specimen Submitted:  A. THYROID GLAND, LEFT, LLP, FINE NEEDLE  ASPIRATION:    FINAL MICROSCOPIC DIAGNOSIS:  - Scant follicular epithelium present (Bethesda category I)   FNA left inferior nodule 10/30/2020  Clinical History: TI-RADS total points 5  Specimen Submitted:  A. THYROID GLAND, LEFT, LLP - NODULE # 5, FINE  NEEDLE ASPIRATION:    FINAL MICROSCOPIC DIAGNOSIS:  - Nondiagnostic material      ASSESSMENT / PLAN / RECOMMENDATIONS:   Hyperthyroidism :   - She is clinically euthyroid  - No local neck symptoms  - Most likely due to toxic thyroid nodule - TFT's normal , will continue current dose of methimazole    Medications : Continue Methimazole 5 mg , half tablet daily      2.  MNG :  - No local neck symptoms.  - Prior FNA of the right nodule with benign cytology (02/2018)  - FNA of the left isthmic nodule initially scant cellularity in 2021 but  with Atypia of undetermined significance (Bethesda category III) in 10/2020 with Benign molecular testing  - FNA of the left inferior nodules TWICE revealed scan cellularity  - We discussed diagnostic lobectomy vs total thyroidectomy, she is reluctant at this time, We opted to try for another uptake and scan , we had tried in the past but we had insurance issues.    - She was advised to hold methimazole 5-7 days prior to uptake and scan and resume when done with the scan  -  Will also proceed with thyroid ultrasound     F/U in 6 months   I spent 25 minutes preparing to see the patient by review of recent labs, imaging and procedures, obtaining and reviewing separately obtained history, communicating with the patient/family or caregiver, ordering medications, tests or procedures, and documenting clinical information in the EHR including the differential Dx, treatment, and any further evaluation and other management   Signed electronically by: Lyndle Herrlich, MD  [facility] Endocrinology  [facility] [address] [place], [place] [address] Phone: [phone] FAX: [phone]      CC: Shifflett, Carlis Stable [address] [place] [address] Phone: [phone]  Fax: [phone]   Return to Endocrinology clinic as below: No future appointments.

## 2021-04-23 NOTE — Patient Instructions (Addendum)
Thyroid Uptake and Scan works like this: We would first check a thyroid "scan" (a special, but easy and painless type of thyroid x ray).  you go to the x-ray department of the hospital to swallow a pill, which contains a miniscule amount of radiation.  You will not notice any symptoms from this.  You will go back to the x-ray department the next day, to lie down in front of a camera.  The results of this will be sent to me.       HOLD Methimazole 5 -7 days before the thyroid uptake and scan     You may schedule your thyroid ultrasound on one of the two days that you will be here for the uptake and scan   The  ultrasound place number is [phone]

## 2021-04-24 MED ORDER — METHIMAZOLE 5 MG PO TABS
2.5000 mg | ORAL_TABLET | Freq: Every day | ORAL | 3 refills | Status: DC
Start: 2021-04-24 — End: 2021-05-16

## 2021-04-28 ENCOUNTER — Telehealth: Payer: Self-pay | Admitting: Internal Medicine

## 2021-04-28 NOTE — Telephone Encounter (Signed)
PT called and stated [facility] and they don't accept her insurance. Wants to know where she can go to get the scan done

## 2021-04-28 NOTE — Telephone Encounter (Signed)
Please advise 

## 2021-04-28 NOTE — Telephone Encounter (Signed)
Called and spoke with patient, she said that imaging will not take her insurance pt needs to have the order change to different company pt is welling to come [place] have this done if she needs  too.

## 2021-05-05 ENCOUNTER — Telehealth: Payer: Self-pay | Admitting: Internal Medicine

## 2021-05-05 DIAGNOSIS — E042 Nontoxic multinodular goiter: Secondary | ICD-10-CM

## 2021-05-05 NOTE — Telephone Encounter (Signed)
Julie Gutierrez,   This is the pt that needs thyroid uptake and scan as well as thyroid ultrasound in [place] , [place]     It looks like she has a thyroid ultrasound scheduled here, can you please cancel that ?   Since its such a long drive, we might just do it all in [place]   Thanks a lot

## 2021-05-07 ENCOUNTER — Other Ambulatory Visit: Payer: Medicaid Other

## 2021-05-14 ENCOUNTER — Telehealth: Payer: Self-pay | Admitting: Internal Medicine

## 2021-05-14 NOTE — Telephone Encounter (Signed)
Spoke with pharmacy to clarify dose.

## 2021-05-14 NOTE — Telephone Encounter (Signed)
Julie Gutierrez is calling from pharmacy and has some questions about methimazole (TAPAZOLE) 5 MG tablet  Needs to verify the dose

## 2021-05-15 ENCOUNTER — Telehealth: Payer: Self-pay

## 2021-05-16 ENCOUNTER — Other Ambulatory Visit: Payer: Self-pay | Admitting: Internal Medicine

## 2021-05-16 MED ORDER — METHIMAZOLE 5 MG PO TABS
5.0000 mg | ORAL_TABLET | Freq: Every day | ORAL | 1 refills | Status: DC
Start: 2021-05-16 — End: 2022-04-10

## 2021-05-16 MED ORDER — METHIMAZOLE 5 MG PO TABS
2.5000 mg | ORAL_TABLET | Freq: Every day | ORAL | 3 refills | Status: DC
Start: 2021-05-16 — End: 2021-05-16

## 2021-05-16 NOTE — Telephone Encounter (Signed)
Julie Gutierrez with [facility] ph# [phone] called re: RX received for Methimazole. Julie Gutierrez states it too soon to fill Rx for Methimazole due to Patient has been taking 1 whole tablet per day instead of 1/2 tablet per day.

## 2021-05-16 NOTE — Telephone Encounter (Signed)
Patient notified

## 2021-05-16 NOTE — Telephone Encounter (Signed)
New script sent for 1 tab daily.

## 2021-06-28 IMAGING — US US FNA BIOPSY THYROID 1ST LESION
1 series · 13 of 25 positions shown · non-contrast
Comparison: US Thyroid 06/24/20

INDICATION: Indeterminate thyroid nodule

Isthmus nodule: 1.7 cm
Left inferior nodule: 1.9 cm
Repeat Biopsies - [REDACTED] 1 07/23/20
EXAM:
ULTRASOUND GUIDED FINE NEEDLE ASPIRATION OF INDETERMINATE THYROID
NODULE
TECHNIQUE: Informed written consent was obtained from the patient after a
discussion of the risks, benefits and alternatives to treatment.
Questions regarding the procedure were encouraged and answered. A
timeout was performed prior to the initiation of the procedure.

[Series 1: us fna biopsy thyroid 1st lesion · 0.06mm/px · 52 acquisitions, 13 frames shown]
[im 1/52]
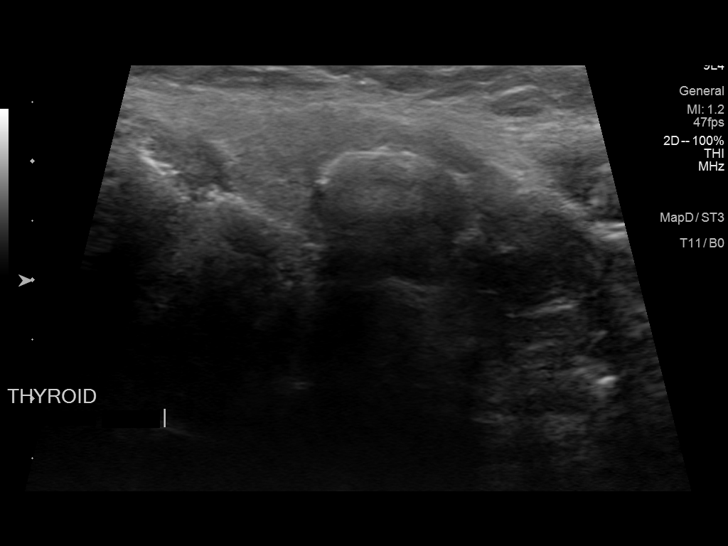
[im 5/52]
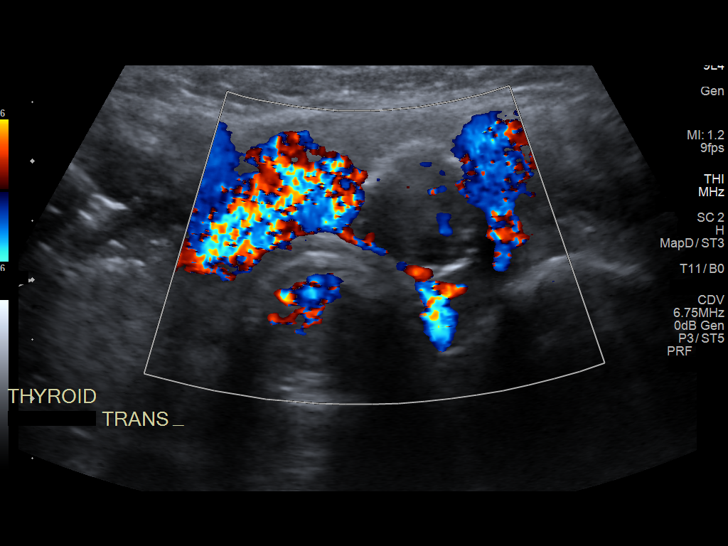
[im 9/52]
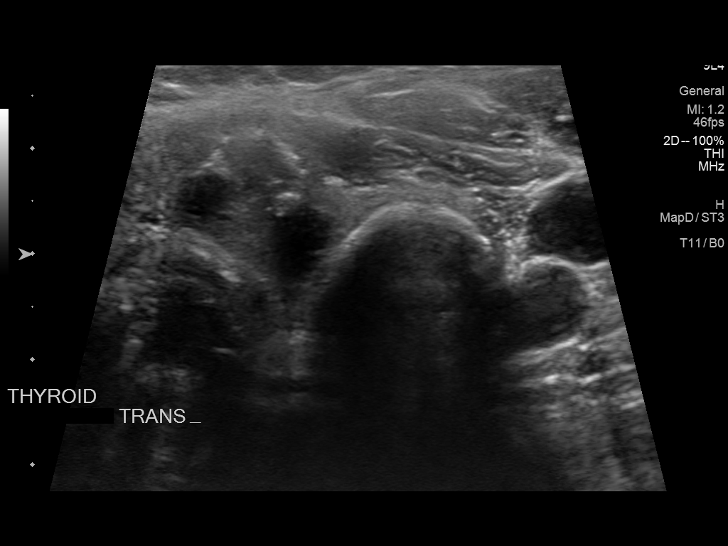
[im 13/52]
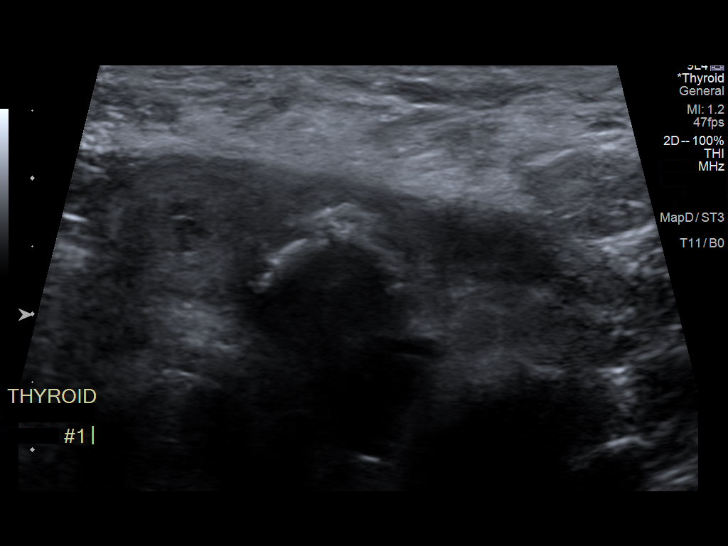
[im 18/52]
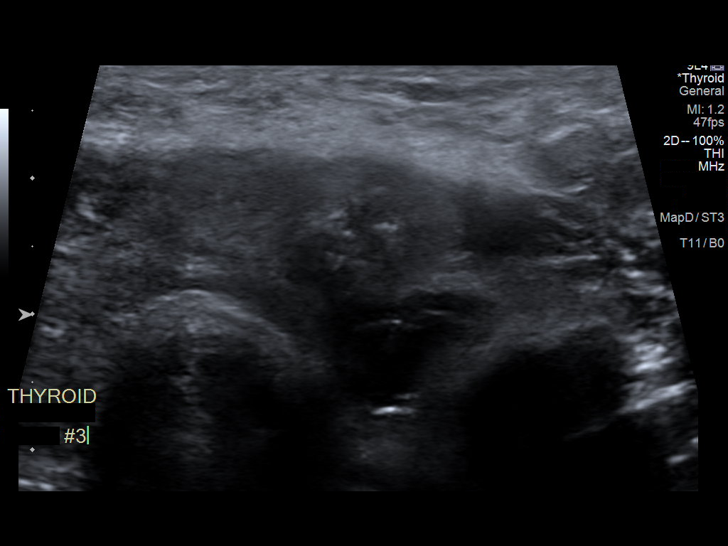
[im 22/52]
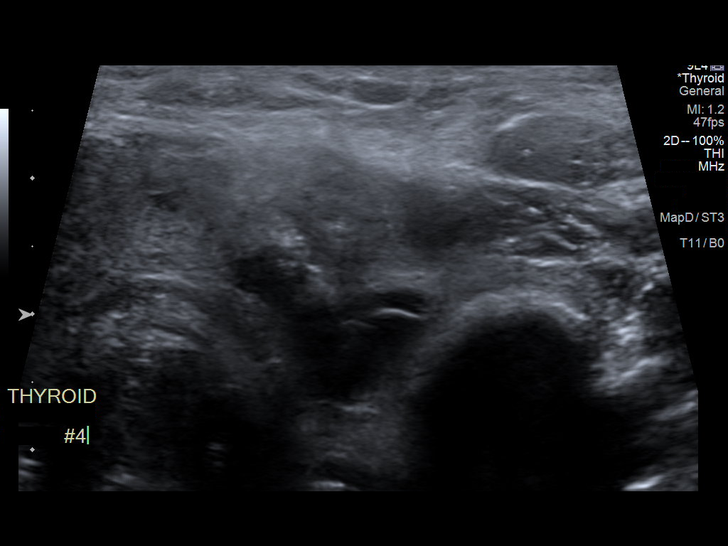
[im 26/52]
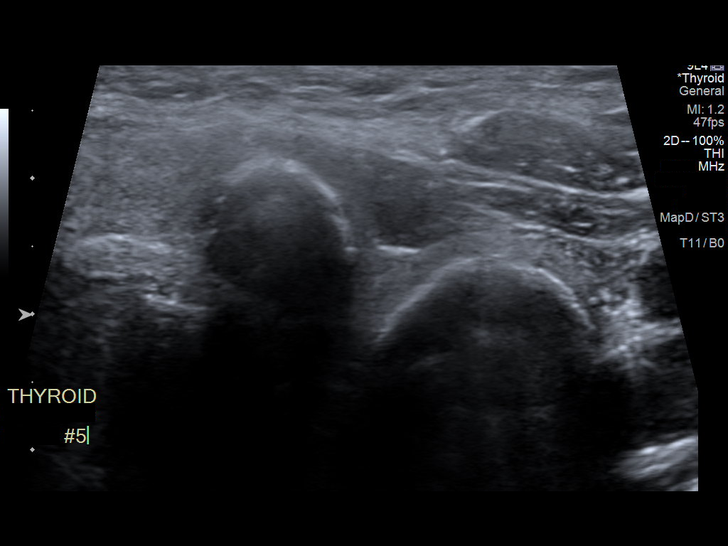
[im 30/52]
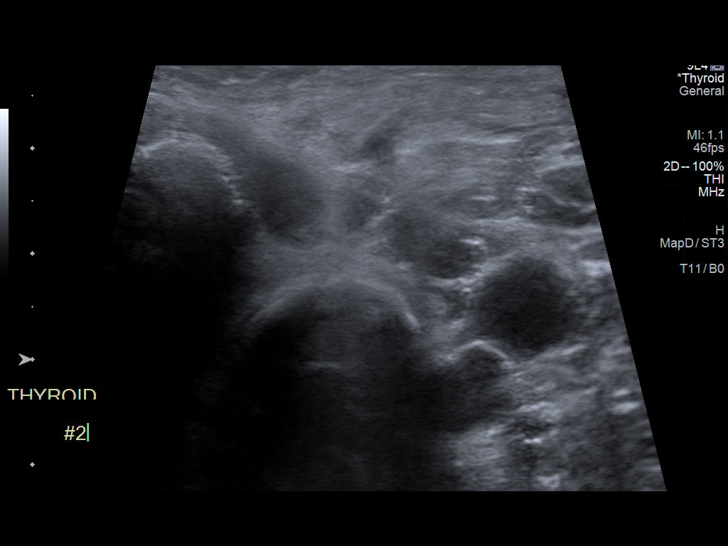
[im 35/52]
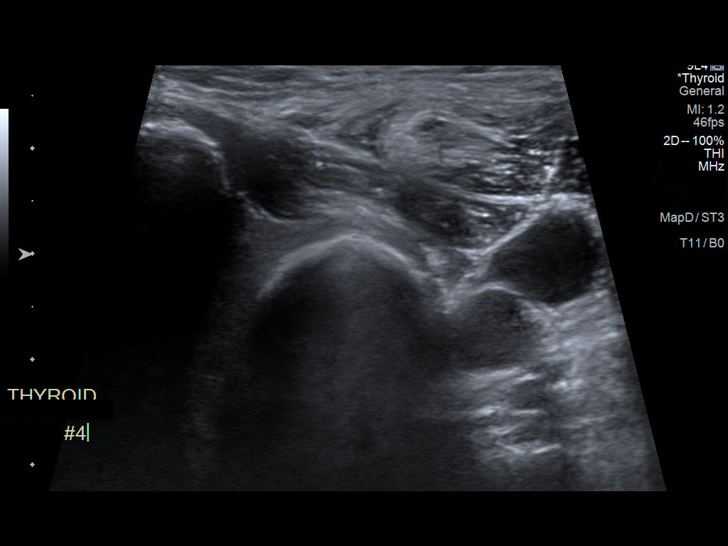
[im 39/52]
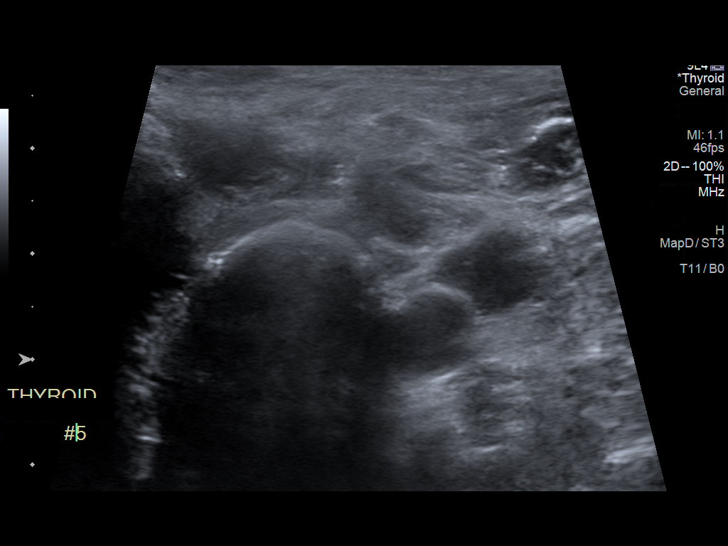
[im 43/52]
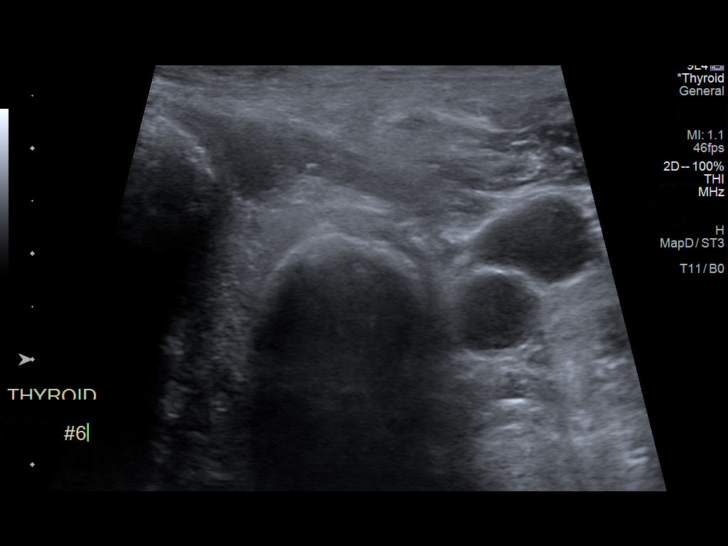
[im 47/52]
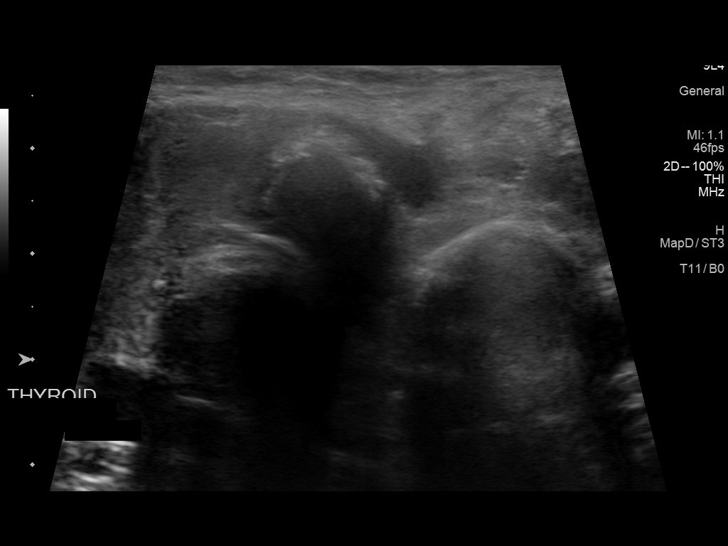
[im 52/52]
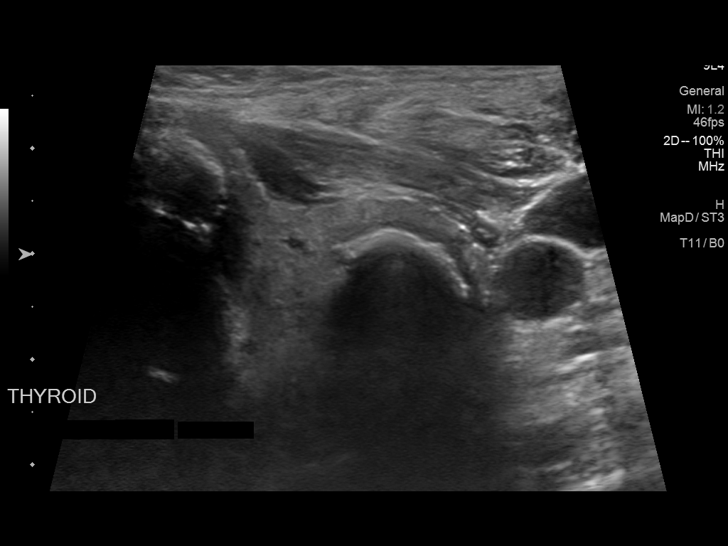

[13 of 25 positions shown; findings below may reference images not displayed]

Biopsy same nodules 07/23/20: Both [REDACTED] 1

MEDICATIONS:
22 cc 1% lidocaine

COMPLICATIONS:
None immediate.
Pre-procedural ultrasound scanning demonstrated unchanged size and
appearance of the indeterminate nodules within the isthmus and left
thyroid

The procedure was planned. The neck was prepped in the usual sterile
fashion, and a sterile drape was applied covering the operative
field. A timeout was performed prior to the initiation of the
procedure. Local anesthesia was provided with 1% lidocaine.

Under direct ultrasound guidance, 5 FNA biopsies were performed of
the isthmus nodule with a 25 gauge needle.

2 of these samples were obtained for AFIRMA

Multiple ultrasound images were saved for procedural documentation
purposes. The samples were prepared and submitted to pathology.

Under direct ultrasound guidance, 6 FNA biopsies were performed of
the left inferior thyroid nodule with a 25 gauge needle.

2 of these samples were obtained for AFIRMA

Multiple ultrasound images were saved for procedural documentation
purposes. The samples were prepared and submitted to pathology.

Limited post procedural scanning was negative for hematoma or
additional complication. Dressings were placed. The patient
tolerated the above procedures procedure well without immediate
postprocedural complication.
FINDINGS: Nodule reference number based on prior diagnostic ultrasound: 1

Maximum size: 1.7 cm

Location: Isthmus; Inferior

ACR TI-RADS risk category: TR4 (4-6 points)

Reason for biopsy: nondiagnostic on prior biopsy

_________________________________________________________

Nodule reference number based on prior diagnostic ultrasound: 5

Maximum size: 1.9 cm

Location: Left; Inferior

ACR TI-RADS risk category: TR4 (4-6 points)

Reason for biopsy: nondiagnostic on prior biopsy

Ultrasound imaging confirms appropriate placement of the needles
within the thyroid nodule.
IMPRESSION: 1. Technically successful ultrasound guided fine needle aspiration
of inferior isthmus nodule.
2. Technically successful ultrasound guided fine needle aspiration
of left inferior thyroid nodule.

Read by

[employer] Algreni

## 2021-10-23 ENCOUNTER — Other Ambulatory Visit: Payer: Self-pay | Admitting: Internal Medicine

## 2021-10-23 ENCOUNTER — Telehealth: Payer: Self-pay | Admitting: Internal Medicine

## 2021-10-23 DIAGNOSIS — E042 Nontoxic multinodular goiter: Secondary | ICD-10-CM

## 2021-10-23 NOTE — Telephone Encounter (Signed)
Can you please reschedule this pt with me from 2/22nd till after her thyroid uptake and scan which will be early March ?  So bring her back to see me after 3/8th please    Thanks

## 2021-10-29 ENCOUNTER — Ambulatory Visit: Payer: Medicaid Other | Admitting: Internal Medicine

## 2021-11-05 ENCOUNTER — Other Ambulatory Visit (HOSPITAL_COMMUNITY): Payer: PRIVATE HEALTH INSURANCE

## 2021-11-05 ENCOUNTER — Other Ambulatory Visit: Payer: Self-pay

## 2021-11-05 ENCOUNTER — Encounter (HOSPITAL_COMMUNITY)
Admission: RE | Admit: 2021-11-05 | Discharge: 2021-11-05 | Disposition: A | Discharge status: Home / Self Care | Payer: Medicaid Other | Source: Ambulatory Visit | Attending: Internal Medicine | Admitting: Internal Medicine

## 2021-11-05 ENCOUNTER — Ambulatory Visit (HOSPITAL_COMMUNITY)
Admission: RE | Admit: 2021-11-05 | Discharge: 2021-11-05 | Disposition: A | Discharge status: Home / Self Care | Payer: Medicaid Other | Source: Ambulatory Visit | Attending: Internal Medicine | Admitting: Internal Medicine

## 2021-11-05 DIAGNOSIS — E042 Nontoxic multinodular goiter: Secondary | ICD-10-CM

## 2021-11-05 MED ORDER — SODIUM IODIDE I 131 CAPSULE
16.4000 | Freq: Once | INTRAVENOUS | Status: AC | PRN
  Administered 2021-11-05: 16.4 via ORAL

## 2021-11-06 ENCOUNTER — Other Ambulatory Visit (HOSPITAL_COMMUNITY): Payer: PRIVATE HEALTH INSURANCE

## 2021-11-06 ENCOUNTER — Encounter (HOSPITAL_COMMUNITY)
Admission: RE | Admit: 2021-11-06 | Discharge: 2021-11-06 | Disposition: A | Discharge status: Home / Self Care | Payer: Medicaid Other | Source: Ambulatory Visit | Attending: Internal Medicine | Admitting: Internal Medicine

## 2021-11-06 DIAGNOSIS — E042 Nontoxic multinodular goiter: Secondary | ICD-10-CM | POA: Diagnosis not present

## 2021-11-07 ENCOUNTER — Telehealth: Payer: Self-pay

## 2021-11-07 MED ORDER — SODIUM PERTECHNETATE TC 99M INJECTION
4.2000 | Freq: Once | INTRAVENOUS | Status: AC | PRN
  Administered 2021-11-07: 4.2 via INTRAVENOUS

## 2021-11-07 MED ORDER — SODIUM IODIDE I 131 CAPSULE
16.4000 | Freq: Once | INTRAVENOUS | Status: AC | PRN
  Administered 2021-11-07: 16.4 via ORAL

## 2021-11-07 NOTE — Telephone Encounter (Signed)
Mychart message sent to patient.

## 2021-11-07 NOTE — Telephone Encounter (Signed)
Patient has finished her imaging and would like to know when she can start back on her Methimazole?  ?

## 2021-11-13 ENCOUNTER — Encounter: Payer: Self-pay | Admitting: Internal Medicine

## 2021-11-13 ENCOUNTER — Other Ambulatory Visit: Payer: Self-pay

## 2021-11-13 ENCOUNTER — Ambulatory Visit (INDEPENDENT_AMBULATORY_CARE_PROVIDER_SITE_OTHER): Payer: Medicaid Other | Admitting: Internal Medicine

## 2021-11-13 VITALS — BP 132/80 | HR 76 | Ht 60.0 in | Wt 146.0 lb

## 2021-11-13 DIAGNOSIS — E042 Nontoxic multinodular goiter: Secondary | ICD-10-CM

## 2021-11-13 DIAGNOSIS — E0590 Thyrotoxicosis, unspecified without thyrotoxic crisis or storm: Secondary | ICD-10-CM | POA: Diagnosis not present

## 2021-11-13 NOTE — Progress Notes (Unsigned)
Name: Julie Gutierrez  MRN/ DOB: [id number], Nov 18, 1960    Age/ Sex: 61 y.o., female     PCP: Shifflett, Carlis StableJeremy, PA-C   Reason for Endocrinology Evaluation: Hyperthyroidism     Initial Endocrinology Clinic Visit: 05/31/2019    PATIENT IDENTIFIER: Julie Gutierrez is a 61 y.o., female with a past medical history of HTN and Dyslipidemia. She has followed with [facility] since 05/31/2019 for consultative assistance with management of her hyperthyroidism.   HISTORICAL SUMMARY: The patient has been diagnosed with right thyroid nodule in her 30's. She recalls having a thyroid uptake and scan at the time as well as a biopsy with benign findings per pt. In 2019 she was started on Methimazole due to thyroid overactivity.  She also had a right nodule FNA on 02/23/2018 with benign cytology per patient. (Previous endocrinology notes not available)   Se is S/P FNA of the two left inferior nodules ( 1.9 cm and 1.7 cm )  With scan cellularity (07/2020) Repeat FNA in 10/2020 showed Atypia of the left isthmic nodule with benign Afirma and scant cellularity of the left inferior nodule     Sister with thyroid nodules  SUBJECTIVE:    Today (11/13/2021):  Julie Gutierrez is here for a  follow up on hyperthyroidism.     She has been compliant with methimazole 5 mg , half a tablet daily   Denies local neck symptoms Has occasional diarrhea due to Metformin  Denies palpitations  Denies tremors     HISTORY:  Past Medical History: No past medical history on file. Past Surgical History: The histories are not reviewed yet. Please review them in the "History" navigator section and refresh this SmartLink. Social History:  reports that she has been smoking. She has never used smokeless tobacco. She reports that she does not drink alcohol and does not use drugs. Family History:  Family History  Problem Relation Age of Onset   Bladder Cancer Mother    Diabetes Father    Goiter Sister       HOME MEDICATIONS: Allergies as of 11/13/2021       Reactions   Prednisone Other (See Comments)   hallucinations   Sulfacetamide Hives        Medication List        Accurate as of November 13, 2021 12:36 PM. If you have any questions, ask your nurse or doctor.          amLODipine 10 MG tablet Commonly known as: NORVASC Take by mouth.   atenolol 50 MG tablet Commonly known as: TENORMIN Take by mouth.   atorvastatin 20 MG tablet Commonly known as: LIPITOR Take 20 mg by mouth at bedtime.   escitalopram 5 MG tablet Commonly known as: LEXAPRO Take by mouth.   fexofenadine 60 MG tablet Commonly known as: ALLEGRA Take by mouth.   fluticasone 50 MCG/ACT nasal spray Commonly known as: FLONASE Place into the nose.   meclizine 25 MG tablet Commonly known as: ANTIVERT Take by mouth.   METFORMIN HCL PO Take 1,000 mg by mouth. Takes half a tablet daily, then another half as needed   methimazole 5 MG tablet Commonly known as: TAPAZOLE Take 1 tablet (5 mg total) by mouth daily.   multivitamin-iron-minerals-folic acid chewable tablet Chew 1 tablet by mouth daily.   omeprazole 20 MG capsule Commonly known as: PRILOSEC Take by mouth.   triamterene-hydrochlorothiazide 37.5-25 MG tablet Commonly known as: MAXZIDE-25 Take 1 tablet by mouth daily.  OBJECTIVE:   PHYSICAL EXAM: VS: BP 132/80 (BP Location: Left Arm, Patient Position: Sitting, Cuff Size: Small)    Pulse 76    Ht 5' (1.524 m)    Wt 146 lb (66.2 kg)    SpO2 98%    BMI 28.51 kg/m    EXAM: General: Pt appears well and is in NAD  Neck: General: Supple without adenopathy. Thyroid: Thyroid size normal.left isthmic nodule ~ 1.5 cm   Lungs: Clear with good BS bilat with no rales, rhonchi, or wheezes  Heart: Auscultation: RRR.  Abdomen: Normoactive bowel sounds, soft, nontender, without masses or organomegaly palpable  Extremities: BL LE: No pretibial edema normal ROM and strength.  Mental  Status: Judgment, insight: Intact Orientation: Oriented to time, place, and person Mood and affect: No depression, anxiety, or agitation     DATA REVIEWED:  ***    Thyroid Ultrasound 11/05/2021 Estimated total number of nodules >/= 1 cm: 5   Number of spongiform nodules >/=  2 cm not described below (TR1): 0   Number of mixed cystic and solid nodules >/= 1.5 cm not described below (TR2): 0   _________________________________________________________   Nodule 1: 1.4 x 1.2 x 1.0 cm peripherally calcified isthmus nodule is not significantly changed in size since prior study. Prior FNA of this nodule was performed on 07/23/2020 and 10/30/2020. Please correlate with prior FNA results.   _________________________________________________________   Nodule # 2:   Location: Right; superior   Maximum size: 1.2 cm; Other 2 dimensions: 1.1 x 1.0 cm, previously 1.2 x 1.2 x 0.9 cm.   Composition: solid/almost completely solid (2)   Echogenicity: isoechoic (1)   Shape: not taller-than-wide (0)   Margins: smooth (0)   Echogenic foci: peripheral calcifications (2)   ACR TI-RADS total points: 5.   ACR TI-RADS risk category: TR4 (4-6 points).   ACR TI-RADS recommendations:   *Given size (>/= 1 - 1.4 cm) and appearance, a follow-up ultrasound in 1 year should be considered based on TI-RADS criteria.   _________________________________________________________   Nodule 3: 2.0 x 1.9 x 1.4 cm spongiform nodule in the mid right thyroid lobe is slightly smaller in size since prior examination and does not meet criteria for FNA or imaging surveillance.   _________________________________________________________   Nodule 4: 0.8 cm spongiform nodule in the inferior right thyroid lobe does not meet criteria for imaging surveillance or FNA.   _________________________________________________________   Nodule 5: 0.8 cm solid hypoechoic nodule in the inferior right thyroid lobe does  not meet criteria for imaging surveillance or FNA.   _________________________________________________________   Nodule 6: 2.1 x 2.1 x 1.6 cm left mid thyroid nodule is not significantly changed in size from prior examination. Prior FNA was performed on 07/23/2020 and 10/30/2020. Please correlate with prior FNA results.   _________________________________________________________   Nodule 7: 1.5 x 1.6 x 0.9 cm spongiform nodule in the inferior left thyroid lobe has decreased in size since 06/24/2020. It does not meet criteria for imaging surveillance or FNA.   IMPRESSION: 1. Nodule 2 (TI-RADS 4), measuring 1.2 cm, located in the superior right thyroid lobe is unchanged in size since 06/24/2020. It meets criteria for imaging follow-up. Annual ultrasound surveillance is recommended until 5 years of stability is documented. 2. Nodules 1 and 6 were previously biopsied on 07/22/2020 and 10/30/2020. They are unchanged in size. Please correlate with prior FNA results. 3. Remaining thyroid nodules do not meet criteria for FNA or further follow-up.  ____________________________________________________    FNA 07/23/2020  Clinical History: Isthmus; Inferior left 1.7 cm; Other 2 dimensions: 1.1  x 1.3 cm, TI-RADS total points 5  Specimen Submitted:  A. THYROID GLAND, INFERIOR LEFT ISTHMUS, FINE  NEEDLE ASPIRATION:    FINAL MICROSCOPIC DIAGNOSIS:  - Scant follicular epithelium present (Bethesda category I)  FNA left isthmic inferior 10/30/2020 Specimen Submitted:  A. THYROID GLAND, INFERIOR ISTHMUS LT OF ML -  NODULE #1, FINE NEEDLE ASPIRATION:   FINAL MICROSCOPIC DIAGNOSIS:  - Atypia of undetermined significance (Bethesda category III)   Afirma benign    FNA 07/23/2020 Clinical History: Left inferior 1.9 cm; Other 2 dimensions: 1.4 x 1.5  cm, TI-RADS total points 5  Specimen Submitted:  A. THYROID GLAND, LEFT, LLP, FINE NEEDLE  ASPIRATION:    FINAL MICROSCOPIC DIAGNOSIS:  -  Scant follicular epithelium present (Bethesda category I)   FNA left inferior nodule 10/30/2020  Clinical History: TI-RADS total points 5  Specimen Submitted:  A. THYROID GLAND, LEFT, LLP - NODULE # 5, FINE  NEEDLE ASPIRATION:    FINAL MICROSCOPIC DIAGNOSIS:  - Nondiagnostic material    ASSESSMENT / PLAN / RECOMMENDATIONS:   Hyperthyroidism :   - She is clinically euthyroid  - No local neck symptoms  - Most likely due to toxic thyroid nodule - TFT's normal , will continue current dose of methimazole    Medications : Continue Methimazole 5 mg , half tablet daily      2.  MNG :  - No local neck symptoms.  - Prior FNA of the right nodule with benign cytology (02/2018)  - FNA of the left isthmic nodule initially scant cellularity in 2021 but with Atypia of undetermined significance (Bethesda category III) in 10/2020 with Benign molecular testing  - FNA of the left inferior nodules TWICE revealed scan cellularity  - We discussed diagnostic lobectomy vs total thyroidectomy, she is reluctant at this time. Uptake and Scan showed    - She was advised to hold methimazole 5-7 days prior to uptake and scan and resume when done with the scan  -  Will also proceed with thyroid ultrasound     F/U in 6 months   Signed electronically by: Lyndle Herrlich, MD  [facility]  [facility] [address] [place] [place], [place] [address] Phone: [phone] FAX: [phone]      CC: Shifflett, Carlis Stable [address] [place] [address] Phone: [phone]  Fax: [phone]   Return to Endocrinology clinic as below: No future appointments.

## 2021-11-13 NOTE — Patient Instructions (Signed)
-   Continue Methimazole 5 mg , half a tablet daily  

## 2022-04-10 ENCOUNTER — Other Ambulatory Visit: Payer: Self-pay

## 2022-04-10 MED ORDER — METHIMAZOLE 5 MG PO TABS: 2.5000 mg | ORAL_TABLET | Freq: Every day | ORAL | 0 refills | Status: DC

## 2022-05-19 ENCOUNTER — Ambulatory Visit: Payer: PRIVATE HEALTH INSURANCE | Admitting: Internal Medicine

## 2022-05-19 NOTE — Progress Notes (Deleted)
Name: Julie Gutierrez  MRN/ DOB: [id number], [birth year]    Age/ Sex: 61 y.o., female     PCP: Shifflett, Carlis Stable   Reason for Endocrinology Evaluation: Hyperthyroidism     Initial Endocrinology Clinic Visit: 05/31/2019    PATIENT IDENTIFIER: Julie Gutierrez is a 61 y.o., female with a past medical history of HTN and Dyslipidemia. She has followed with [facility] since 05/31/2019 for consultative assistance with management of her hyperthyroidism.   HISTORICAL SUMMARY: The patient has been diagnosed with right thyroid nodule in her 30's. She recalls having a thyroid uptake and scan at the time as well as a biopsy with benign findings per pt. In 2019 she was started on Methimazole due to thyroid overactivity.  She also had a right nodule FNA on 02/23/2018 with benign cytology per patient. (Previous endocrinology notes not available)   Se is S/P FNA of the two left inferior nodules ( 1.9 cm and 1.7 cm )  With scan cellularity (07/2020) Repeat FNA in 10/2020 showed Atypia of the left isthmic nodule with benign Afirma and scant cellularity of the left inferior nodule     Sister with thyroid nodules  SUBJECTIVE:    Today (05/19/2022):  Julie Gutierrez is here for a  follow up on hyperthyroidism.     She has been compliant with methimazole 5 mg , half a tablet daily   Denies local neck symptoms Has occasional diarrhea due to Metformin  Denies palpitations  Denies tremors     HISTORY:  Past Medical History: No past medical history on file. Past Surgical History: The histories are not reviewed yet. Please review them in the "History" navigator section and refresh this SmartLink. Social History:  reports that she has been smoking. She has never used smokeless tobacco. She reports that she does not drink alcohol and does not use drugs. Family History:  Family History  Problem Relation Age of Onset   Bladder Cancer Mother    Diabetes Father    Goiter Sister       HOME MEDICATIONS: Allergies as of 05/19/2022       Reactions   Prednisone Other (See Comments)   hallucinations   Sulfacetamide Hives        Medication List        Accurate as of May 19, 2022  6:59 AM. If you have any questions, ask your nurse or doctor.          amLODipine 10 MG tablet Commonly known as: NORVASC Take by mouth.   atenolol 50 MG tablet Commonly known as: TENORMIN Take by mouth.   atorvastatin 20 MG tablet Commonly known as: LIPITOR Take 20 mg by mouth at bedtime.   escitalopram 5 MG tablet Commonly known as: LEXAPRO Take by mouth.   fexofenadine 60 MG tablet Commonly known as: ALLEGRA Take by mouth.   fluticasone 50 MCG/ACT nasal spray Commonly known as: FLONASE Place into the nose.   meclizine 25 MG tablet Commonly known as: ANTIVERT Take by mouth.   METFORMIN HCL PO Take 1,000 mg by mouth. Takes half a tablet daily, then another half as needed   methimazole 5 MG tablet Commonly known as: TAPAZOLE Take 0.5 tablets (2.5 mg total) by mouth daily.   multivitamin-iron-minerals-folic acid chewable tablet Chew 1 tablet by mouth daily.   omeprazole 20 MG capsule Commonly known as: PRILOSEC Take by mouth.   triamterene-hydrochlorothiazide 37.5-25 MG tablet Commonly known as: MAXZIDE-25 Take 1 tablet by mouth daily.  OBJECTIVE:   PHYSICAL EXAM: VS: There were no vitals taken for this visit.   EXAM: General: Pt appears well and is in NAD  Neck: General: Supple without adenopathy. Thyroid: Thyroid size normal.left isthmic nodule ~ 1.5 cm   Lungs: Clear with good BS bilat with no rales, rhonchi, or wheezes  Heart: Auscultation: RRR.  Abdomen: Normoactive bowel sounds, soft, nontender, without masses or organomegaly palpable  Extremities: BL LE: No pretibial edema normal ROM and strength.  Mental Status: Judgment, insight: Intact Orientation: Oriented to time, place, and person Mood and affect: No  depression, anxiety, or agitation     DATA REVIEWED:    Latest Reference Range & Units 04/23/21 11:23  TSH 0.35 - 5.50 uIU/mL 2.03  T4,Free(Direct) 0.60 - 1.60 ng/dL 9.02     Thyroid Ultrasound 11/05/2021 Estimated total number of nodules >/= 1 cm: 5   Number of spongiform nodules >/=  2 cm not described below (TR1): 0   Number of mixed cystic and solid nodules >/= 1.5 cm not described below (TR2): 0   _________________________________________________________   Nodule 1: 1.4 x 1.2 x 1.0 cm peripherally calcified isthmus nodule is not significantly changed in size since prior study. Prior FNA of this nodule was performed on 07/23/2020 and 10/30/2020. Please correlate with prior FNA results.   _________________________________________________________   Nodule # 2:   Location: Right; superior   Maximum size: 1.2 cm; Other 2 dimensions: 1.1 x 1.0 cm, previously 1.2 x 1.2 x 0.9 cm.   Composition: solid/almost completely solid (2)   Echogenicity: isoechoic (1)   Shape: not taller-than-wide (0)   Margins: smooth (0)   Echogenic foci: peripheral calcifications (2)   ACR TI-RADS total points: 5.   ACR TI-RADS risk category: TR4 (4-6 points).   ACR TI-RADS recommendations:   *Given size (>/= 1 - 1.4 cm) and appearance, a follow-up ultrasound in 1 year should be considered based on TI-RADS criteria.   _________________________________________________________   Nodule 3: 2.0 x 1.9 x 1.4 cm spongiform nodule in the mid right thyroid lobe is slightly smaller in size since prior examination and does not meet criteria for FNA or imaging surveillance.   _________________________________________________________   Nodule 4: 0.8 cm spongiform nodule in the inferior right thyroid lobe does not meet criteria for imaging surveillance or FNA.   _________________________________________________________   Nodule 5: 0.8 cm solid hypoechoic nodule in the inferior right thyroid  lobe does not meet criteria for imaging surveillance or FNA.   _________________________________________________________   Nodule 6: 2.1 x 2.1 x 1.6 cm left mid thyroid nodule is not significantly changed in size from prior examination. Prior FNA was performed on 07/23/2020 and 10/30/2020. Please correlate with prior FNA results.   _________________________________________________________   Nodule 7: 1.5 x 1.6 x 0.9 cm spongiform nodule in the inferior left thyroid lobe has decreased in size since 06/24/2020. It does not meet criteria for imaging surveillance or FNA.   IMPRESSION: 1. Nodule 2 (TI-RADS 4), measuring 1.2 cm, located in the superior right thyroid lobe is unchanged in size since 06/24/2020. It meets criteria for imaging follow-up. Annual ultrasound surveillance is recommended until 5 years of stability is documented. 2. Nodules 1 and 6 were previously biopsied on 07/22/2020 and 10/30/2020. They are unchanged in size. Please correlate with prior FNA results. 3. Remaining thyroid nodules do not meet criteria for FNA or further follow-up.  ____________________________________________________    FNA Left isthmic nodule 07/23/2020 Clinical History: Isthmus; Inferior left 1.7 cm; Other 2 dimensions:  1.1  x 1.3 cm, TI-RADS total points 5  Specimen Submitted:  A. THYROID GLAND, INFERIOR LEFT ISTHMUS, FINE  NEEDLE ASPIRATION:    FINAL MICROSCOPIC DIAGNOSIS:  - Scant follicular epithelium present (Bethesda category I)  FNA left isthmic inferior 10/30/2020 Specimen Submitted:  A. THYROID GLAND, INFERIOR ISTHMUS LT OF ML -  NODULE #1, FINE NEEDLE ASPIRATION:   FINAL MICROSCOPIC DIAGNOSIS:  - Atypia of undetermined significance (Bethesda category III)   Afirma benign    FNA 07/23/2020 Clinical History: Left inferior 1.9 cm; Other 2 dimensions: 1.4 x 1.5  cm, TI-RADS total points 5  Specimen Submitted:  A. THYROID GLAND, LEFT, LLP, FINE NEEDLE  ASPIRATION:     FINAL MICROSCOPIC DIAGNOSIS:  - Scant follicular epithelium present (Bethesda category I)   FNA left inferior nodule 10/30/2020  Clinical History: TI-RADS total points 5  Specimen Submitted:  A. THYROID GLAND, LEFT, LLP - NODULE # 5, FINE  NEEDLE ASPIRATION:    FINAL MICROSCOPIC DIAGNOSIS:  - Nondiagnostic material    ASSESSMENT / PLAN / RECOMMENDATIONS:   Hyperthyroidism :   - She is clinically euthyroid  - No local neck symptoms  - Most likely due to toxic thyroid nodule - TFT's normal , will continue current dose of methimazole    Medications : Continue Methimazole 5 mg , half tablet daily      2.  MNG :  - No local neck symptoms.  - Prior FNA of the right nodule with benign cytology (02/2018)  - FNA of the left isthmic nodule initially scant cellularity in 2021 but with Atypia of undetermined significance (Bethesda category III) in 10/2020 with Benign molecular testing  - FNA of the left inferior nodules TWICE revealed scan cellularity  - We discussed diagnostic lobectomy vs total thyroidectomy, she is reluctant at this time. Uptake and Scan showed photopenia at the left superior and left mid thyroid lobe     F/U in 6 months   Signed electronically by: Lyndle Herrlich, MD  [facility]  [facility] [address] [place], [place] [address] Phone: [phone] FAX: [phone]      CC: Shifflett, Carlis Stable [address] [place] [address] Phone: [phone]  Fax: [phone]   Return to Endocrinology clinic as below: Future Appointments  Date Time Provider Department Center  05/19/2022 11:30 AM Lakeeta Dobosz, Konrad Dolores, MD [id number] None

## 2022-06-11 ENCOUNTER — Encounter: Payer: Self-pay | Admitting: Internal Medicine

## 2022-06-11 ENCOUNTER — Ambulatory Visit (INDEPENDENT_AMBULATORY_CARE_PROVIDER_SITE_OTHER): Payer: Medicaid Other | Admitting: Internal Medicine

## 2022-06-11 VITALS — BP 136/80 | HR 65 | Ht 60.0 in | Wt 141.0 lb

## 2022-06-11 DIAGNOSIS — E0590 Thyrotoxicosis, unspecified without thyrotoxic crisis or storm: Secondary | ICD-10-CM | POA: Diagnosis not present

## 2022-06-11 DIAGNOSIS — E042 Nontoxic multinodular goiter: Secondary | ICD-10-CM

## 2022-06-11 LAB — TSH: TSH: 1.18 u[IU]/mL (ref 0.35–5.50)

## 2022-06-11 LAB — T4, FREE: Free T4: 0.94 ng/dL (ref 0.60–1.60)

## 2022-06-11 NOTE — Progress Notes (Signed)
Name: Julie Gutierrez  MRN/ DOB: [id number], 11-May-1961    Age/ Sex: 61 y.o., female     PCP: Shifflett, Lorelee Market   Reason for Endocrinology Evaluation: Hyperthyroidism     Initial Endocrinology Clinic Visit: 05/31/2019    PATIENT IDENTIFIER: Julie Gutierrez is a 61 y.o., female with a past medical history of HTN and Dyslipidemia. She has followed with [facility] since 05/31/2019 for consultative assistance with management of her hyperthyroidism.   HISTORICAL SUMMARY: The patient has been diagnosed with right thyroid nodule in her 30's. She recalls having a thyroid uptake and scan at the time as well as a biopsy with benign findings per pt. In 2019 she was started on Methimazole due to thyroid overactivity.  She also had a right nodule FNA on 02/23/2018 with benign cytology per patient. (Previous endocrinology notes not available)   Se is S/P FNA of the two left inferior nodules ( 1.9 cm and 1.7 cm )  With scan cellularity (07/2020) Repeat FNA in 10/2020 showed Atypia of the left isthmic nodule with benign Afirma and scant cellularity of the left inferior nodule   She had an uptake and scan on 11/2021 that showed multinodular goiter with photopenia on the left with a 24-hour uptake 36.3 of I-131  Sister with thyroid nodules  SUBJECTIVE:    Today (06/11/2022):  Julie Gutierrez is here for a  follow up on hyperthyroidism and MNG   She has been noted with weight loss that she attributes to Metformin  Also has once a week diarrhea episode that she also attributes to Metformin  She had recent COVID Stable neck  Denies recent episodes of palpitations Denies tremors   Methimazole 5 mg , half a tablet daily       HISTORY:  Past Medical History: No past medical history on file. Past Surgical History: The histories are not reviewed yet. Please review them in the "History" navigator section and refresh this Lares. Social History:  reports that she has been  smoking. She has never used smokeless tobacco. She reports that she does not drink alcohol and does not use drugs. Family History:  Family History  Problem Relation Age of Onset   Bladder Cancer Mother    Diabetes Father    Goiter Sister      HOME MEDICATIONS: Allergies as of 06/11/2022       Reactions   Prednisone Other (See Comments)   hallucinations   Sulfacetamide Hives        Medication List        Accurate as of June 11, 2022  9:17 AM. If you have any questions, ask your nurse or doctor.          STOP taking these medications    escitalopram 5 MG tablet Commonly known as: LEXAPRO Stopped by: Dorita Sciara, MD       TAKE these medications    amLODipine 10 MG tablet Commonly known as: NORVASC Take by mouth.   atenolol 50 MG tablet Commonly known as: TENORMIN Take by mouth.   atorvastatin 20 MG tablet Commonly known as: LIPITOR Take 20 mg by mouth at bedtime.   fexofenadine 60 MG tablet Commonly known as: ALLEGRA Take by mouth.   fluticasone 50 MCG/ACT nasal spray Commonly known as: FLONASE Place into the nose.   meclizine 25 MG tablet Commonly known as: ANTIVERT Take by mouth.   METFORMIN HCL PO Take 1,000 mg by mouth. Takes half a tablet daily, then another half  as needed   methimazole 5 MG tablet Commonly known as: TAPAZOLE Take 0.5 tablets (2.5 mg total) by mouth daily.   multivitamin-iron-minerals-folic acid chewable tablet Chew 1 tablet by mouth daily.   omeprazole 20 MG capsule Commonly known as: PRILOSEC Take by mouth.   triamterene-hydrochlorothiazide 37.5-25 MG tablet Commonly known as: MAXZIDE-25 Take 1 tablet by mouth daily.          OBJECTIVE:   PHYSICAL EXAM: VS: BP 136/80 (BP Location: Right Arm, Patient Position: Sitting, Cuff Size: Small)   Pulse 65   Ht 5' (1.524 m)   Wt 141 lb (64 kg)   SpO2 93%   BMI 27.54 kg/m    EXAM: General: Pt appears well and is in NAD  Neck: General: Supple  without adenopathy. Thyroid: Thyroid size normal.left isthmic nodule ~ 1.5 cm   Lungs: Clear with good BS bilat with no rales, rhonchi, or wheezes  Heart: Auscultation: RRR.  Abdomen: Normoactive bowel sounds, soft, nontender, without masses or organomegaly palpable  Extremities: BL LE: No pretibial edema normal ROM and strength.  Mental Status: Judgment, insight: Intact Orientation: Oriented to time, place, and person Mood and affect: No depression, anxiety, or agitation     DATA REVIEWED:    Latest Reference Range & Units 06/11/22 09:40  TSH 0.35 - 5.50 uIU/mL 1.18  Triiodothyronine (T3) 76 - 181 ng/dL 161  W9,UEAV(WUJWJX) 9.14 - 1.60 ng/dL 7.82   Thyroid Uptake and scan 11/07/2021  FINDINGS: There is diffuse thyromegaly, right lobe larger than left, with heterogeneous radiotracer activity consistent with multinodular goiter. Relative photopenia within the lateral aspect left lobe thyroid corresponding to a presumed calcified cystic area on recent ultrasound.   4 hour I-131 uptake = 16.2% (normal 5-20%)   24 hour I-131 uptake = 36.3% (normal 10-30%)   IMPRESSION: 1. Thyromegaly with diffuse heterogeneous uptake consistent with known toxic multinodular goiter. 2. Normal radiotracer uptake at 4 hours, with mild increased radiotracer uptake at 24 hours.      Thyroid Ultrasound 11/05/2021 Estimated total number of nodules >/= 1 cm: 5   Number of spongiform nodules >/=  2 cm not described below (TR1): 0   Number of mixed cystic and solid nodules >/= 1.5 cm not described below (TR2): 0   _________________________________________________________   Nodule 1: 1.4 x 1.2 x 1.0 cm peripherally calcified isthmus nodule is not significantly changed in size since prior study. Prior FNA of this nodule was performed on 07/23/2020 and 10/30/2020. Please correlate with prior FNA results.   _________________________________________________________   Nodule # 2:   Location:  Right; superior   Maximum size: 1.2 cm; Other 2 dimensions: 1.1 x 1.0 cm, previously 1.2 x 1.2 x 0.9 cm.   Composition: solid/almost completely solid (2)   Echogenicity: isoechoic (1)   Shape: not taller-than-wide (0)   Margins: smooth (0)   Echogenic foci: peripheral calcifications (2)   ACR TI-RADS total points: 5.   ACR TI-RADS risk category: TR4 (4-6 points).   ACR TI-RADS recommendations:   *Given size (>/= 1 - 1.4 cm) and appearance, a follow-up ultrasound in 1 year should be considered based on TI-RADS criteria.   _________________________________________________________   Nodule 3: 2.0 x 1.9 x 1.4 cm spongiform nodule in the mid right thyroid lobe is slightly smaller in size since prior examination and does not meet criteria for FNA or imaging surveillance.   _________________________________________________________   Nodule 4: 0.8 cm spongiform nodule in the inferior right thyroid lobe does not meet criteria for  imaging surveillance or FNA.   _________________________________________________________   Nodule 5: 0.8 cm solid hypoechoic nodule in the inferior right thyroid lobe does not meet criteria for imaging surveillance or FNA.   _________________________________________________________   Nodule 6: 2.1 x 2.1 x 1.6 cm left mid thyroid nodule is not significantly changed in size from prior examination. Prior FNA was performed on 07/23/2020 and 10/30/2020. Please correlate with prior FNA results.   _________________________________________________________   Nodule 7: 1.5 x 1.6 x 0.9 cm spongiform nodule in the inferior left thyroid lobe has decreased in size since 06/24/2020. It does not meet criteria for imaging surveillance or FNA.   IMPRESSION: 1. Nodule 2 (TI-RADS 4), measuring 1.2 cm, located in the superior right thyroid lobe is unchanged in size since 06/24/2020. It meets criteria for imaging follow-up. Annual ultrasound surveillance  is recommended until 5 years of stability is documented. 2. Nodules 1 and 6 were previously biopsied on 07/22/2020 and 10/30/2020. They are unchanged in size. Please correlate with prior FNA results. 3. Remaining thyroid nodules do not meet criteria for FNA or further follow-up.  ____________________________________________________    FNA Left isthmic nodule 07/23/2020 Clinical History: Isthmus; Inferior left 1.7 cm; Other 2 dimensions: 1.1  x 1.3 cm, TI-RADS total points 5  Specimen Submitted:  A. THYROID GLAND, INFERIOR LEFT ISTHMUS, FINE  NEEDLE ASPIRATION:    FINAL MICROSCOPIC DIAGNOSIS:  - Scant follicular epithelium present (Bethesda category I)  FNA left isthmic inferior 10/30/2020 Specimen Submitted:  A. THYROID GLAND, INFERIOR ISTHMUS LT OF ML -  NODULE #1, FINE NEEDLE ASPIRATION:   FINAL MICROSCOPIC DIAGNOSIS:  - Atypia of undetermined significance (Bethesda category III)   Afirma benign    FNA 07/23/2020 Clinical History: Left inferior 1.9 cm; Other 2 dimensions: 1.4 x 1.5  cm, TI-RADS total points 5  Specimen Submitted:  A. THYROID GLAND, LEFT, LLP, FINE NEEDLE  ASPIRATION:    FINAL MICROSCOPIC DIAGNOSIS:  - Scant follicular epithelium present (Bethesda category I)   FNA left inferior nodule 10/30/2020  Clinical History: TI-RADS total points 5  Specimen Submitted:  A. THYROID GLAND, LEFT, LLP - NODULE # 5, FINE  NEEDLE ASPIRATION:    FINAL MICROSCOPIC DIAGNOSIS:  - Nondiagnostic material    ASSESSMENT / PLAN / RECOMMENDATIONS:   Hyperthyroidism :   - She is clinically euthyroid  - No local neck symptoms  - Most likely due to toxic thyroid nodule - TFT's normal , will continue current dose of methimazole    Medications : Continue Methimazole 5 mg , half tablet daily      2.  MNG :  - No local neck symptoms.  - Prior FNA of the right nodule with benign cytology (02/2018)  - FNA of the left isthmic nodule initially scant cellularity in 2021  but with Atypia of undetermined significance (Bethesda category III) in 10/2020 with Benign molecular testing  - FNA of the left inferior nodule TWICE revealed scan cellularity , but this appears to be hot on the uptake and scan , I again explained to the patient that I am unable to confirm benignity and the risk of cancer is 3-5% - We discussed diagnostic lobectomy vs total thyroidectomy, she can continues to be reluctant -We will repeat thyroid ultrasound by next visit    F/U in 6 months   Signed electronically by: Lyndle Herrlich, MD  [facility]  [facility] [address] [place] [place], [place] [address] Phone: [phone] FAX: [phone]  CC: Shifflett, Carlis Stable [address] [place] [address] Phone: [phone]  Fax: [phone]   Return to Endocrinology clinic as below: No future appointments.

## 2022-06-12 LAB — T3: T3, Total: 124 ng/dL (ref 76–181)

## 2022-06-12 MED ORDER — METHIMAZOLE 5 MG PO TABS: 2.5000 mg | ORAL_TABLET | Freq: Every day | ORAL | 3 refills | Status: DC

## 2022-07-04 IMAGING — US US THYROID
1 series · 12 of 25 positions shown · non-contrast
Comparison: 10/30/2020

07/23/2020

07/23/2020

06/24/2020

CLINICAL DATA: Multinodular goiter follow-up

EXAM:
THYROID ULTRASOUND
TECHNIQUE: Ultrasound examination of the thyroid gland and adjacent soft
tissues was performed.

[Series 1: us thyroid · 12 of 71 slices shown]
[im 3/71]
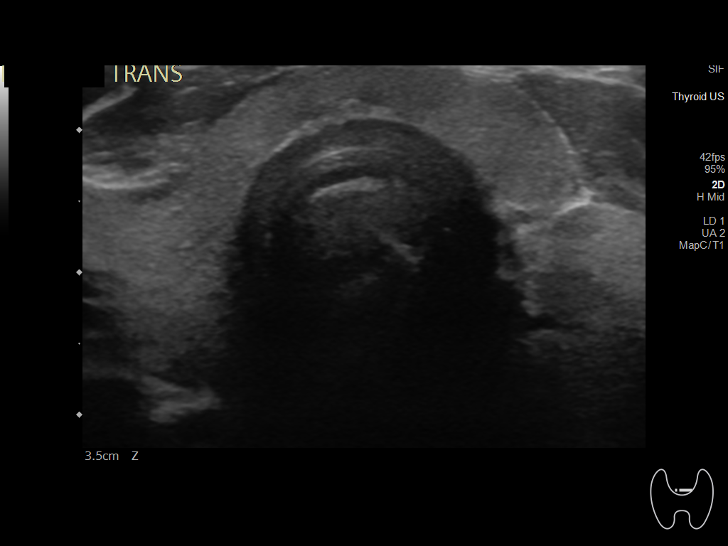
[im 9/71]
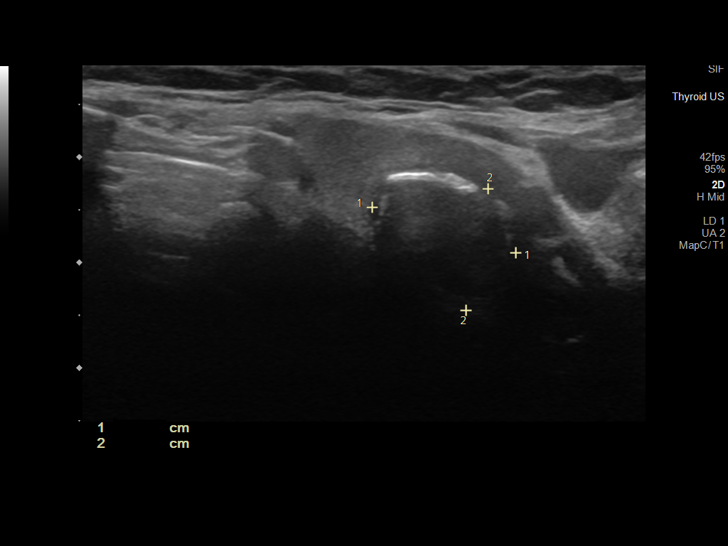
[im 15/71]
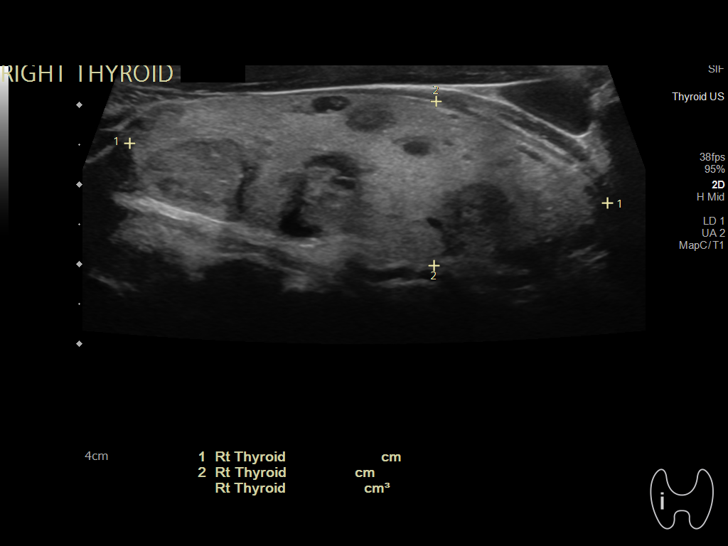
[im 21/71]
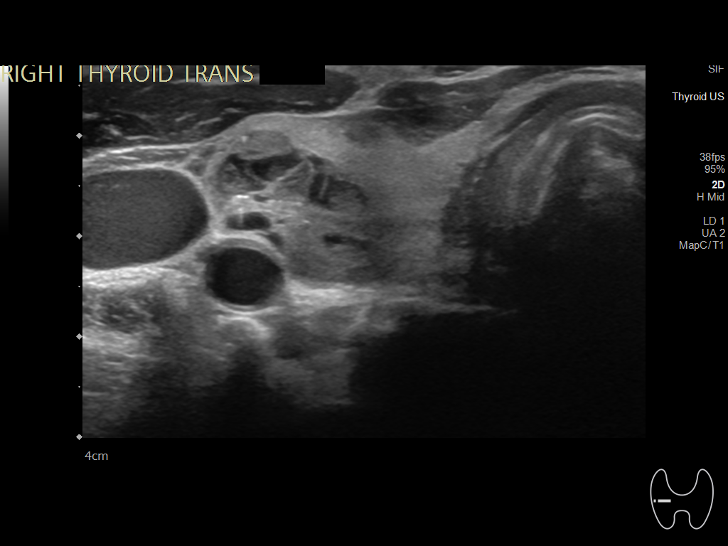
[im 27/71]
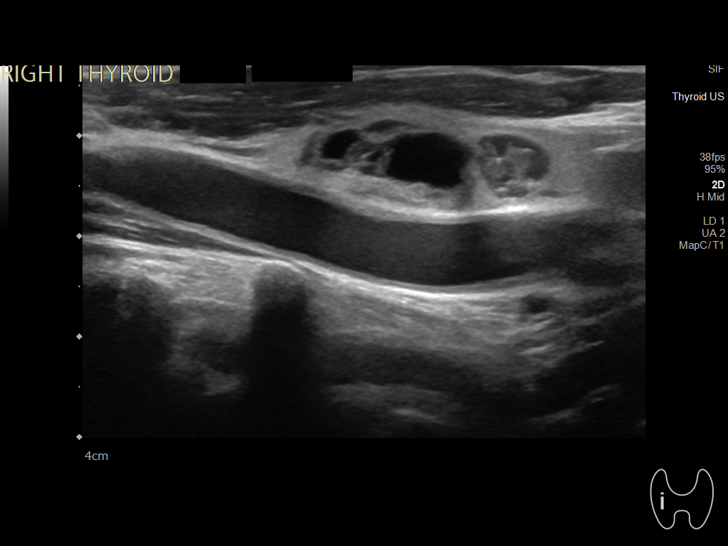
[im 33/71]
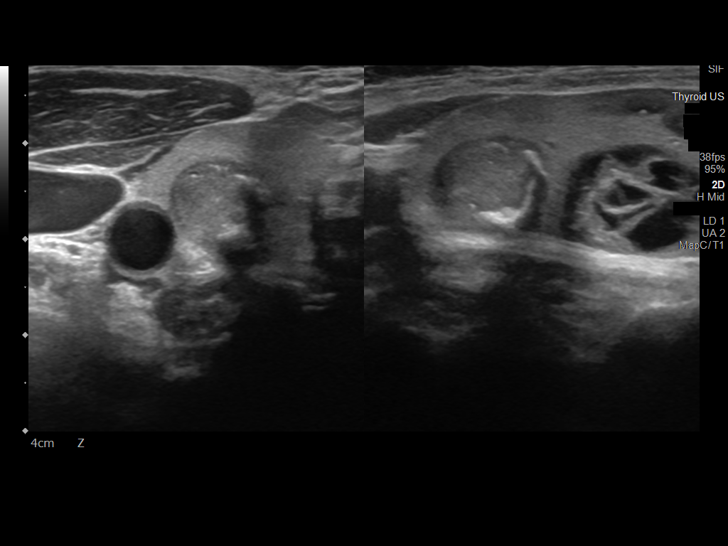
[im 38/71]
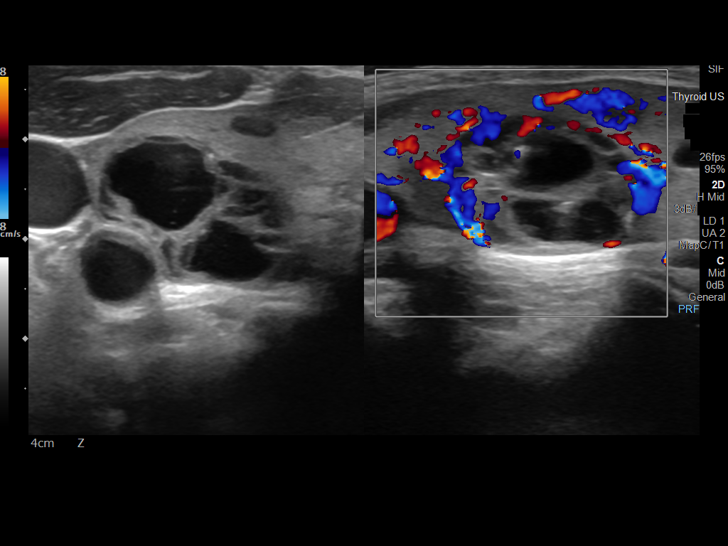
[im 44/71]
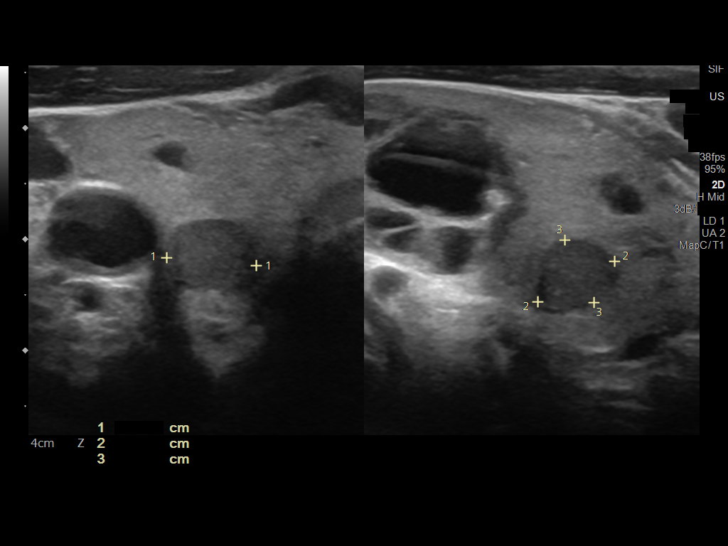
[im 50/71]
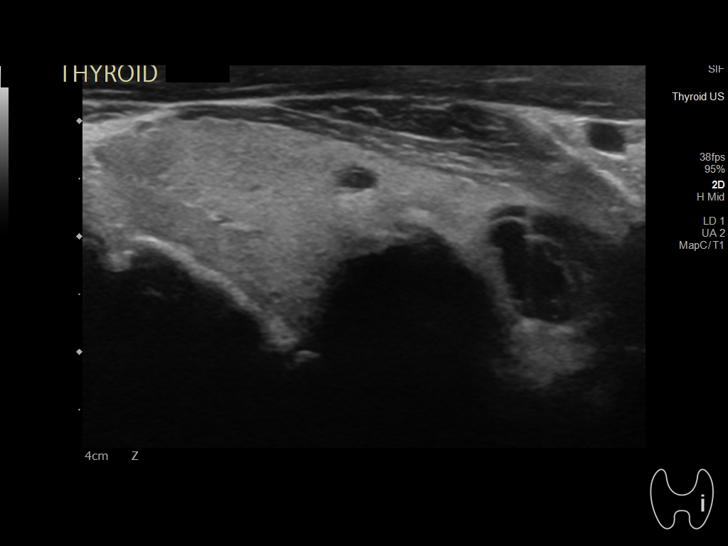
[im 56/71]
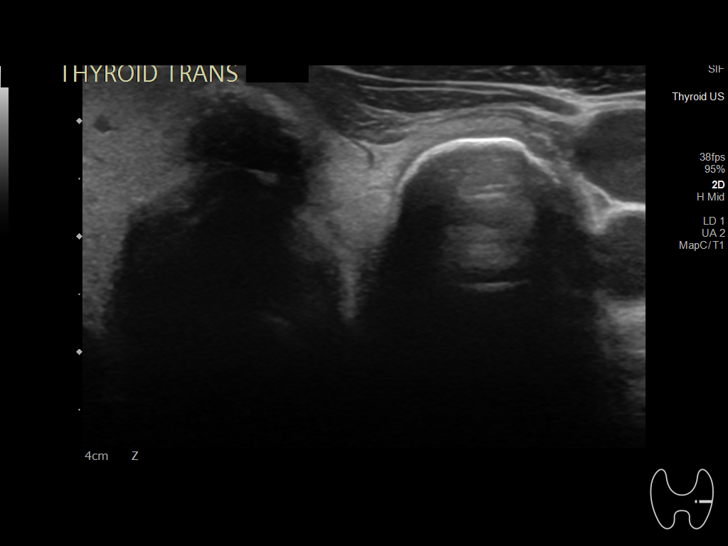
[im 62/71]
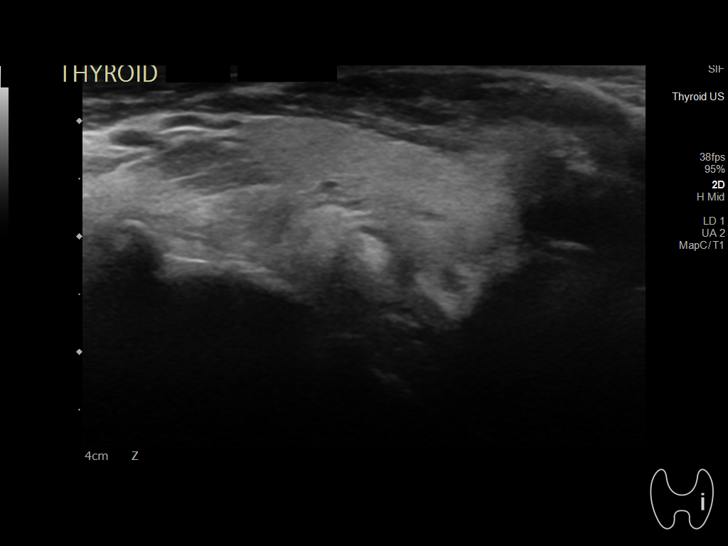
[im 68/71]
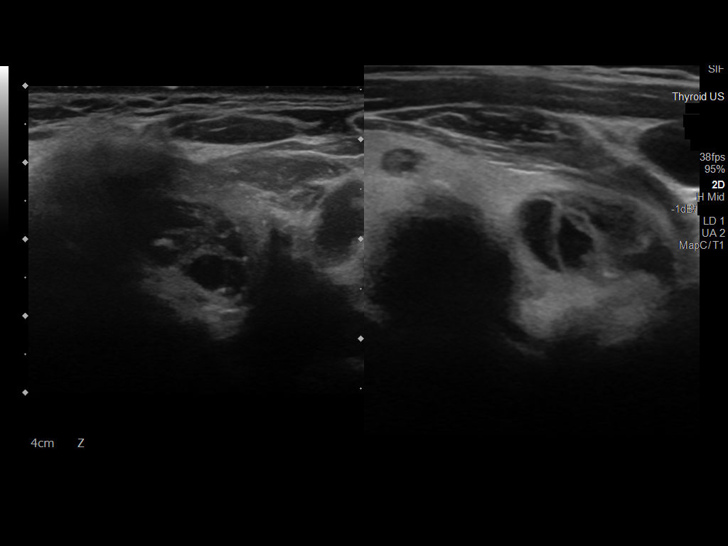

[12 of 25 positions shown; findings below may reference images not displayed]

FINDINGS: Parenchymal Echotexture: Mildly heterogenous

Isthmus: 0.5 cm

Right lobe: 6.1 x 2.1 x 2.8 cm

Left lobe: 4.8 x 2.1 x 2.4 cm

_________________________________________________________

Estimated total number of nodules >/= 1 cm: 5

Number of spongiform nodules >/=  2 cm not described below (TR1): 0

Number of mixed cystic and solid nodules >/= 1.5 cm not described
below (TR2): 0

_________________________________________________________

Nodule 1: 1.4 x 1.2 x 1.0 cm peripherally calcified isthmus nodule
is not significantly changed in size since prior study. Prior FNA of
this nodule was performed on 07/23/2020 and 10/30/2020. Please
correlate with prior FNA results.

_________________________________________________________

Nodule # 2:

Location: Right; superior

Maximum size: 1.2 cm; Other 2 dimensions: 1.1 x 1.0 cm, previously
1.2 x 1.2 x 0.9 cm.

Composition: solid/almost completely solid (2)

Echogenicity: isoechoic (1)

Shape: not taller-than-wide (0)

Margins: smooth (0)

Echogenic foci: peripheral calcifications (2)

ACR TI-RADS total points: 5.

ACR TI-RADS risk category: TR4 (4-6 points).

ACR TI-RADS recommendations:

*Given size (>/= 1 - 1.4 cm) and appearance, a follow-up ultrasound
in 1 year should be considered based on TI-RADS criteria.

_________________________________________________________

Nodule 3: 2.0 x 1.9 x 1.4 cm spongiform nodule in the mid right
thyroid lobe is slightly smaller in size since prior examination and
does not meet criteria for FNA or imaging surveillance.

_________________________________________________________

Nodule 4: 0.8 cm spongiform nodule in the inferior right thyroid
lobe does not meet criteria for imaging surveillance or FNA.

_________________________________________________________

Nodule 5: 0.8 cm solid hypoechoic nodule in the inferior right
thyroid lobe does not meet criteria for imaging surveillance or FNA.

_________________________________________________________

Nodule 6: 2.1 x 2.1 x 1.6 cm left mid thyroid nodule is not
significantly changed in size from prior examination. Prior FNA was
performed on 07/23/2020 and 10/30/2020. Please correlate with prior
FNA results.

_________________________________________________________

Nodule 7: 1.5 x 1.6 x 0.9 cm spongiform nodule in the inferior left
thyroid lobe has decreased in size since 06/24/2020. It does not
meet criteria for imaging surveillance or FNA.
IMPRESSION: 1. Nodule 2 (TI-RADS 4), measuring 1.2 cm, located in the superior
right thyroid lobe is unchanged in size since 06/24/2020. It meets
criteria for imaging follow-up. Annual ultrasound surveillance is
recommended until 5 years of stability is documented.
2. Nodules 1 and 6 were previously biopsied on 07/22/2020 and
10/30/2020. They are unchanged in size. Please correlate with prior
FNA results.
3. Remaining thyroid nodules do not meet criteria for FNA or further
follow-up.

The above is in keeping with the ACR TI-RADS recommendations - [HOSPITAL] [id number];[DATE].

## 2022-10-06 ENCOUNTER — Ambulatory Visit: Payer: PRIVATE HEALTH INSURANCE | Admitting: Internal Medicine

## 2022-12-14 ENCOUNTER — Encounter: Payer: Self-pay | Admitting: Internal Medicine

## 2022-12-14 ENCOUNTER — Ambulatory Visit (HOSPITAL_COMMUNITY): Payer: Medicaid Other

## 2022-12-14 ENCOUNTER — Ambulatory Visit (INDEPENDENT_AMBULATORY_CARE_PROVIDER_SITE_OTHER): Payer: Medicaid Other | Admitting: Internal Medicine

## 2022-12-14 ENCOUNTER — Encounter (HOSPITAL_COMMUNITY): Payer: Self-pay

## 2022-12-14 VITALS — BP 124/74 | HR 78 | Ht 60.0 in | Wt 145.0 lb

## 2022-12-14 DIAGNOSIS — E042 Nontoxic multinodular goiter: Secondary | ICD-10-CM | POA: Diagnosis not present

## 2022-12-14 DIAGNOSIS — E0590 Thyrotoxicosis, unspecified without thyrotoxic crisis or storm: Secondary | ICD-10-CM | POA: Diagnosis not present

## 2022-12-14 LAB — COMPREHENSIVE METABOLIC PANEL
ALT: 13 U/L (ref 0–35)
AST: 16 U/L (ref 0–37)
Albumin: 4.1 g/dL (ref 3.5–5.2)
Alkaline Phosphatase: 100 U/L (ref 39–117)
BUN: 12 mg/dL (ref 6–23)
CO2: 25 mEq/L (ref 19–32)
Calcium: 9.5 mg/dL (ref 8.4–10.5)
Chloride: 104 mEq/L (ref 96–112)
Creatinine, Ser: 1.04 mg/dL (ref 0.40–1.20)
GFR: 57.96 mL/min — ABNORMAL LOW (ref >60.00)
Glucose, Bld: 199 mg/dL — ABNORMAL HIGH (ref 70–99)
Potassium: 3.4 mEq/L — ABNORMAL LOW (ref 3.5–5.1)
Sodium: 139 mEq/L (ref 135–145)
Total Bilirubin: 0.5 mg/dL (ref 0.2–1.2)
Total Protein: 6.9 g/dL (ref 6.0–8.3)

## 2022-12-14 LAB — TSH: TSH: 1.12 u[IU]/mL (ref 0.35–5.50)

## 2022-12-14 LAB — CBC
HCT: 41.6 % (ref 36.0–46.0)
Hemoglobin: 14.1 g/dL (ref 12.0–15.0)
MCHC: 33.8 g/dL (ref 30.0–36.0)
MCV: 97.3 fl (ref 78.0–100.0)
Platelets: 216 10*3/uL (ref 150.0–400.0)
RBC: 4.28 Mil/uL (ref 3.87–5.11)
RDW: 13.4 % (ref 11.5–15.5)
WBC: 5.4 10*3/uL (ref 4.0–10.5)

## 2022-12-14 LAB — T4, FREE: Free T4: 0.85 ng/dL (ref 0.60–1.60)

## 2022-12-14 MED ORDER — METHIMAZOLE 5 MG PO TABS: 2.5000 mg | ORAL_TABLET | Freq: Every day | ORAL | 3 refills | Status: DC

## 2022-12-14 NOTE — Progress Notes (Signed)
Name: Julie Gutierrez Prescher  MRN/ DOB: [id number], August 15, 1961    Age/ Sex: 62 y.o., female     PCP: Shifflett, Carlis StableJeremy, PA-C   Reason for Endocrinology Evaluation: Hyperthyroidism     Initial Endocrinology Clinic Visit: 05/31/2019    PATIENT IDENTIFIER: Julie Gutierrez is a 62 y.o., female with a past medical history of HTN and Dyslipidemia. She has followed with [facility] since 05/31/2019 for consultative assistance with management of her hyperthyroidism.   HISTORICAL SUMMARY: The patient has been diagnosed with right thyroid nodule in her 30's. She recalls having a thyroid uptake and scan at the time as well as a biopsy with benign findings per pt. In 2019 she was started on Methimazole due to thyroid overactivity.  She also had a right nodule FNA on 02/23/2018 with benign cytology per patient. (Previous endocrinology notes not available)   Se is S/P FNA of the two left inferior nodules ( 1.9 cm and 1.7 cm )  With scan cellularity (07/2020) Repeat FNA in 10/2020 showed Atypia of the left isthmic nodule with benign Afirma and scant cellularity of the left inferior nodule   She had an uptake and scan on 11/2021 that showed multinodular goiter with photopenia on the left with a 24-hour uptake 36.3 of I-131  Sister with thyroid nodules  SUBJECTIVE:    Today (12/14/2022):  [name] is here for a  follow up on hyperthyroidism and MNG   Weight has been fluctuating  Stable neck  Denies palpitations Denies tremors  Has occasional diarrhea due to Metformin   Methimazole 5 mg , half a tablet daily     HISTORY:  Past Medical History: No past medical history on file. Past Surgical History: The histories are not reviewed yet. Please review them in the "History" navigator section and refresh this SmartLink. Social History:  reports that she has been smoking. She has never used smokeless tobacco. She reports that she does not drink alcohol and does not use drugs. Family  History:  Family History  Problem Relation Age of Onset   Bladder Cancer Mother    Diabetes Father    Goiter Sister      HOME MEDICATIONS: Allergies as of 12/14/2022       Reactions   Prednisone Other (See Comments)   hallucinations   Sulfacetamide Hives        Medication List        Accurate as of December 14, 2022 10:16 AM. If you have any questions, ask your nurse or doctor.          amLODipine 10 MG tablet Commonly known as: NORVASC Take by mouth.   atenolol 50 MG tablet Commonly known as: TENORMIN Take by mouth.   atorvastatin 20 MG tablet Commonly known as: LIPITOR Take 20 mg by mouth at bedtime.   fexofenadine 60 MG tablet Commonly known as: ALLEGRA Take by mouth.   fluticasone 50 MCG/ACT nasal spray Commonly known as: FLONASE Place into the nose.   meclizine 25 MG tablet Commonly known as: ANTIVERT Take by mouth.   METFORMIN HCL PO Take 1,000 mg by mouth. Takes half a tablet daily, then another half as needed   methimazole 5 MG tablet Commonly known as: TAPAZOLE Take 0.5 tablets (2.5 mg total) by mouth daily.   multivitamin-iron-minerals-folic acid chewable tablet Chew 1 tablet by mouth daily.   omeprazole 20 MG capsule Commonly known as: PRILOSEC Take by mouth.   triamterene-hydrochlorothiazide 37.5-25 MG tablet Commonly known as: MAXZIDE-25 Take 1  tablet by mouth daily.          OBJECTIVE:   PHYSICAL EXAM: VS: BP 124/74 (BP Location: Left Arm, Patient Position: Sitting, Cuff Size: Small)   Pulse 78   Ht 5' (1.524 m)   Wt 145 lb (65.8 kg)   SpO2 96%   BMI 28.32 kg/m    EXAM: General: Pt appears well and is in NAD  Neck: General: Supple without adenopathy. Thyroid: Thyroid size normal.left isthmic nodule ~ 1.5 cm   Lungs: Clear with good BS bilat with no rales, rhonchi, or wheezes  Heart: Auscultation: RRR.  Abdomen: Normoactive bowel sounds, soft, nontender, without masses or organomegaly palpable  Extremities: BL LE:  No pretibial edema normal ROM and strength.  Mental Status: Judgment, insight: Intact Orientation: Oriented to time, place, and person Mood and affect: No depression, anxiety, or agitation     DATA REVIEWED:  Latest Reference Range & Units 12/14/22 10:36  Sodium 135 - 145 mEq/L 139  Potassium 3.5 - 5.1 mEq/L 3.4 (L)  Chloride 96 - 112 mEq/L 104  CO2 19 - 32 mEq/L 25  Glucose 70 - 99 mg/dL 814 (H)  BUN 6 - 23 mg/dL 12  Creatinine 4.81 - 8.56 mg/dL 3.14  Calcium 8.4 - 97.0 mg/dL 9.5  Alkaline Phosphatase 39 - 117 U/L 100  Albumin 3.5 - 5.2 g/dL 4.1  AST 0 - 37 U/L 16  ALT 0 - 35 U/L 13  Total Protein 6.0 - 8.3 g/dL 6.9  Total Bilirubin 0.2 - 1.2 mg/dL 0.5  GFR >26.37 mL/min 57.96 (L)    Latest Reference Range & Units 12/14/22 10:36  WBC 4.0 - 10.5 K/uL 5.4  RBC 3.87 - 5.11 Mil/uL 4.28  Hemoglobin 12.0 - 15.0 g/dL 85.8  HCT 85.0 - 27.7 % 41.6  MCV 78.0 - 100.0 fl 97.3  MCHC 30.0 - 36.0 g/dL 41.2  RDW 87.8 - 67.6 % 13.4  Platelets 150.0 - 400.0 K/uL 216.0    Latest Reference Range & Units 12/14/22 10:36  TSH 0.35 - 5.50 uIU/mL 1.12  T4,Free(Direct) 0.60 - 1.60 ng/dL 7.20     Thyroid Uptake and scan 11/07/2021  FINDINGS: There is diffuse thyromegaly, right lobe larger than left, with heterogeneous radiotracer activity consistent with multinodular goiter. Relative photopenia within the lateral aspect left lobe thyroid corresponding to a presumed calcified cystic area on recent ultrasound.   4 hour I-131 uptake = 16.2% (normal 5-20%)   24 hour I-131 uptake = 36.3% (normal 10-30%)   IMPRESSION: 1. Thyromegaly with diffuse heterogeneous uptake consistent with known toxic multinodular goiter. 2. Normal radiotracer uptake at 4 hours, with mild increased radiotracer uptake at 24 hours.      Thyroid Ultrasound 11/05/2021 Estimated total number of nodules >/= 1 cm: 5   Number of spongiform nodules >/=  2 cm not described below (TR1): 0   Number of mixed cystic and  solid nodules >/= 1.5 cm not described below (TR2): 0   _________________________________________________________   Nodule 1: 1.4 x 1.2 x 1.0 cm peripherally calcified isthmus nodule is not significantly changed in size since prior study. Prior FNA of this nodule was performed on 07/23/2020 and 10/30/2020. Please correlate with prior FNA results.   _________________________________________________________   Nodule # 2:   Location: Right; superior   Maximum size: 1.2 cm; Other 2 dimensions: 1.1 x 1.0 cm, previously 1.2 x 1.2 x 0.9 cm.   Composition: solid/almost completely solid (2)   Echogenicity: isoechoic (1)   Shape: not taller-than-wide (  0)   Margins: smooth (0)   Echogenic foci: peripheral calcifications (2)   ACR TI-RADS total points: 5.   ACR TI-RADS risk category: TR4 (4-6 points).   ACR TI-RADS recommendations:   *Given size (>/= 1 - 1.4 cm) and appearance, a follow-up ultrasound in 1 year should be considered based on TI-RADS criteria.   _________________________________________________________   Nodule 3: 2.0 x 1.9 x 1.4 cm spongiform nodule in the mid right thyroid lobe is slightly smaller in size since prior examination and does not meet criteria for FNA or imaging surveillance.   _________________________________________________________   Nodule 4: 0.8 cm spongiform nodule in the inferior right thyroid lobe does not meet criteria for imaging surveillance or FNA.   _________________________________________________________   Nodule 5: 0.8 cm solid hypoechoic nodule in the inferior right thyroid lobe does not meet criteria for imaging surveillance or FNA.   _________________________________________________________   Nodule 6: 2.1 x 2.1 x 1.6 cm left mid thyroid nodule is not significantly changed in size from prior examination. Prior FNA was performed on 07/23/2020 and 10/30/2020. Please correlate with prior FNA results.    _________________________________________________________   Nodule 7: 1.5 x 1.6 x 0.9 cm spongiform nodule in the inferior left thyroid lobe has decreased in size since 06/24/2020. It does not meet criteria for imaging surveillance or FNA.   IMPRESSION: 1. Nodule 2 (TI-RADS 4), measuring 1.2 cm, located in the superior right thyroid lobe is unchanged in size since 06/24/2020. It meets criteria for imaging follow-up. Annual ultrasound surveillance is recommended until 5 years of stability is documented. 2. Nodules 1 and 6 were previously biopsied on 07/22/2020 and 10/30/2020. They are unchanged in size. Please correlate with prior FNA results. 3. Remaining thyroid nodules do not meet criteria for FNA or further follow-up.  ____________________________________________________    FNA Left isthmic nodule 07/23/2020 Clinical History: Isthmus; Inferior left 1.7 cm; Other 2 dimensions: 1.1  x 1.3 cm, TI-RADS total points 5  Specimen Submitted:  A. THYROID GLAND, INFERIOR LEFT ISTHMUS, FINE  NEEDLE ASPIRATION:    FINAL MICROSCOPIC DIAGNOSIS:  - Scant follicular epithelium present (Bethesda category I)  FNA left isthmic inferior 10/30/2020 Specimen Submitted:  A. THYROID GLAND, INFERIOR ISTHMUS LT OF ML -  NODULE #1, FINE NEEDLE ASPIRATION:   FINAL MICROSCOPIC DIAGNOSIS:  - Atypia of undetermined significance (Bethesda category III)   Afirma benign    FNA 07/23/2020 Clinical History: Left inferior 1.9 cm; Other 2 dimensions: 1.4 x 1.5  cm, TI-RADS total points 5  Specimen Submitted:  A. THYROID GLAND, LEFT, LLP, FINE NEEDLE  ASPIRATION:    FINAL MICROSCOPIC DIAGNOSIS:  - Scant follicular epithelium present (Bethesda category I)   FNA left inferior nodule 10/30/2020  Clinical History: TI-RADS total points 5  Specimen Submitted:  A. THYROID GLAND, LEFT, LLP - NODULE # 5, FINE  NEEDLE ASPIRATION:    FINAL MICROSCOPIC DIAGNOSIS:  - Nondiagnostic material    ASSESSMENT  / PLAN / RECOMMENDATIONS:   Hyperthyroidism :   - She is clinically euthyroid  - No local neck symptoms  - Most likely due to toxic thyroid nodule - TFT's normal , will continue current dose of methimazole    Medications : Continue Methimazole 5 mg , half tablet daily      2.  MNG :  - No local neck symptoms.  - Prior FNA of the right nodule with benign cytology (02/2018)  - FNA of the left isthmic nodule initially scant cellularity in 2021 but with Atypia of  undetermined significance (Bethesda category III) in 10/2020 with Benign molecular testing  - FNA of the left inferior nodule TWICE revealed scan cellularity , but this appears to be hot on the uptake and scan , We had discussed the small risk of thyroid cancer in the past  - We discussed diagnostic lobectomy vs total thyroidectomy, but she declined. -We will repeat thyroid ultrasound     F/U in 1 yr   Signed electronically by: Lyndle Herrlich, MD  [facility]  [facility] [address] [place] [place], [place] [address] Phone: [phone] FAX: [phone]      CC: Shifflett, Carlis Stable [address] [place] [address] Phone: [phone]  Fax: [phone]   Return to Endocrinology clinic as below: No future appointments.

## 2022-12-15 LAB — T3: T3, Total: 98 ng/dL (ref 76–181)

## 2022-12-23 ENCOUNTER — Ambulatory Visit (HOSPITAL_COMMUNITY)
Admission: RE | Admit: 2022-12-23 | Discharge: 2022-12-23 | Disposition: A | Discharge status: Home / Self Care | Payer: Medicaid Other | Source: Ambulatory Visit | Attending: Internal Medicine | Admitting: Internal Medicine

## 2022-12-23 DIAGNOSIS — E042 Nontoxic multinodular goiter: Secondary | ICD-10-CM | POA: Insufficient documentation

## 2023-12-13 ENCOUNTER — Other Ambulatory Visit: Payer: Self-pay

## 2023-12-13 MED ORDER — METHIMAZOLE 5 MG PO TABS: 2.5000 mg | ORAL_TABLET | Freq: Every day | ORAL | 1 refills | Status: DC

## 2023-12-14 ENCOUNTER — Ambulatory Visit: Payer: Medicaid Other | Admitting: Internal Medicine

## 2023-12-27 ENCOUNTER — Encounter: Payer: Self-pay | Admitting: Internal Medicine

## 2023-12-27 ENCOUNTER — Ambulatory Visit (INDEPENDENT_AMBULATORY_CARE_PROVIDER_SITE_OTHER): Payer: Medicaid Other | Admitting: Internal Medicine

## 2023-12-27 VITALS — BP 122/68 | HR 80 | Resp 18 | Ht 60.0 in | Wt 143.4 lb

## 2023-12-27 DIAGNOSIS — E0590 Thyrotoxicosis, unspecified without thyrotoxic crisis or storm: Secondary | ICD-10-CM

## 2023-12-27 DIAGNOSIS — E042 Nontoxic multinodular goiter: Secondary | ICD-10-CM

## 2023-12-27 LAB — T4, FREE: Free T4: 1 ng/dL (ref 0.8–1.8)

## 2023-12-27 LAB — TSH: TSH: 0.94 m[IU]/L (ref 0.40–4.50)

## 2023-12-27 NOTE — Progress Notes (Unsigned)
 Name: Julie Gutierrez  MRN/ DOB: [id number], [birth year]    Age/ Sex: 63 y.o., female     PCP: Shifflett, Aida House   Reason for Endocrinology Evaluation: Hyperthyroidism     Initial Endocrinology Clinic Visit: 05/31/2019    PATIENT IDENTIFIER: Julie Gutierrez is a 63 y.o., female with a past medical history of HTN and Dyslipidemia. She has followed with [facility] since 05/31/2019 for consultative assistance with management of her hyperthyroidism.   HISTORICAL SUMMARY: The patient has been diagnosed with right thyroid  nodule in her 30's. She recalls having a thyroid  uptake and scan at the time as well as a biopsy with benign findings per pt. In 2019 she was started on Methimazole  due to thyroid  overactivity.  She also had a right nodule FNA on 02/23/2018 with benign cytology per patient. (Previous endocrinology notes not available)   Se is S/P FNA of the two left inferior nodules ( 1.9 cm and 1.7 cm )  With scan cellularity (07/2020) Repeat FNA in 10/2020 showed Atypia of the left isthmic nodule with benign Afirma and scant cellularity of the left inferior nodule   She had an uptake and scan on 11/2021 that showed multinodular goiter with photopenia on the left with a 24-hour uptake 36.3 of I-131  Sister with thyroid  nodules  SUBJECTIVE:    Today (12/27/2023):  [name] is here for a  follow up on hyperthyroidism and MNG   Weight has been stable  No local neck swelling  Denies palpitations Denies tremors  Has slight anxiety  No changes in bowel movements   Methimazole  5 mg , half a tablet daily     HISTORY:  Past Medical History: No past medical history on file. Past Surgical History: The histories are not reviewed yet. Please review them in the "History" navigator section and refresh this SmartLink. Social History:  reports that she has been smoking. She has never used smokeless tobacco. She reports that she does not drink alcohol and does not use  drugs. Family History:  Family History  Problem Relation Age of Onset   Bladder Cancer Mother    Diabetes Father    Goiter Sister      HOME MEDICATIONS: Allergies as of 12/27/2023       Reactions   Prednisone Other (See Comments)   hallucinations   Sulfacetamide Hives        Medication List        Accurate as of December 27, 2023  6:59 AM. If you have any questions, ask your nurse or doctor.          amLODipine 10 MG tablet Commonly known as: NORVASC Take by mouth.   atenolol 50 MG tablet Commonly known as: TENORMIN Take by mouth.   atorvastatin 20 MG tablet Commonly known as: LIPITOR Take 20 mg by mouth at bedtime.   fexofenadine 60 MG tablet Commonly known as: ALLEGRA Take by mouth.   fluticasone 50 MCG/ACT nasal spray Commonly known as: FLONASE Place into the nose.   meclizine 25 MG tablet Commonly known as: ANTIVERT Take by mouth.   METFORMIN HCL PO Take 1,000 mg by mouth. Takes half a tablet daily, then another half as needed   methimazole  5 MG tablet Commonly known as: TAPAZOLE  Take 0.5 tablets (2.5 mg total) by mouth daily.   multivitamin-iron-minerals-folic acid chewable tablet Chew 1 tablet by mouth daily.   omeprazole 20 MG capsule Commonly known as: PRILOSEC Take by mouth.   triamterene-hydrochlorothiazide 37.5-25 MG tablet  Commonly known as: MAXZIDE-25 Take 1 tablet by mouth daily.          OBJECTIVE:   PHYSICAL EXAM: VS: There were no vitals taken for this visit.   EXAM: General: Pt appears well and is in NAD  Neck: General: Supple without adenopathy. Thyroid : Thyroid  size normal.left isthmic nodule ~ 1.5 cm   Lungs: Clear with good BS bilat with no rales, rhonchi, or wheezes  Heart: Auscultation: RRR.  Abdomen: Normoactive bowel sounds, soft, nontender, without masses or organomegaly palpable  Extremities: BL LE: No pretibial edema normal ROM and strength.  Mental Status: Judgment, insight: Intact Orientation:  Oriented to time, place, and person Mood and affect: No depression, anxiety, or agitation     DATA REVIEWED: ****    Thyroid  Uptake and scan 11/07/2021  FINDINGS: There is diffuse thyromegaly, right lobe larger than left, with heterogeneous radiotracer activity consistent with multinodular goiter. Relative photopenia within the lateral aspect left lobe thyroid  corresponding to a presumed calcified cystic area on recent ultrasound.   4 hour I-131 uptake = 16.2% (normal 5-20%)   24 hour I-131 uptake = 36.3% (normal 10-30%)   IMPRESSION: 1. Thyromegaly with diffuse heterogeneous uptake consistent with known toxic multinodular goiter. 2. Normal radiotracer uptake at 4 hours, with mild increased radiotracer uptake at 24 hours.     Thyroid  Ultrasound 12/23/2022   Estimated total number of nodules >/= 1 cm: 4   Number of spongiform nodules >/=  2 cm not described below (TR1): 0   Number of mixed cystic and solid nodules >/= 1.5 cm not described below (TR2): 0   _________________________________________________________   Nodule 1 is a previously biopsied along the left inferior aspect of the isthmus. This is a peripherally calcified nodule that measures 1.4 x 1.2 x 1.2 cm and previously measured 1.4 x 1.2 x 1.0 cm. This nodule is not significantly changed in size or morphology.   Nodule # 2:   Prior biopsy: No   Location: Right; Superior   Maximum size: 1.3 cm; Other 2 dimensions: 1.2 x 1.3 cm, previously, 1.1 x 1.0 x 1.2 cm   Composition: solid/almost completely solid (2)   Echogenicity: isoechoic (1)   Shape: not taller-than-wide (0)   Margins: smooth (0)   Echogenic foci: peripheral calcifications (2)   ACR TI-RADS total points: 5.   ACR TI-RADS risk category:  TR4 (4-6 points).   Significant change in size (>/= 20% in two dimensions and minimal increase of 2 mm):   Change in features: No   Change in ACR TI-RADS risk category: No   ACR TI-RADS  recommendations:   This nodule is minimally changed since 2021. Recommend continued follow-up until there is at least 5 years of stability.   _________________________________________________________   Nodule # 3:   Prior biopsy: No   Location: Right; Mid   Maximum size: 1.9 cm; Other 2 dimensions: 1.6 x 1.1 cm, previously, 2.0 x 1.4 x 1.9 cm   Composition: mixed cystic and solid (1)   Echogenicity: cannot determine (1)   Shape: not taller-than-wide (0)   Margins: ill-defined (0)   Echogenic foci: none (0)   ACR TI-RADS total points: 2.   ACR TI-RADS risk category:  TR2 (2 points).   Significant change in size (>/= 20% in two dimensions and minimal increase of 2 mm): No   Change in features: No   Change in ACR TI-RADS risk category: No   ACR TI-RADS recommendations:   This nodule does NOT meet TI-RADS  criteria for biopsy or dedicated follow-up.   Nodule 5 is a solid hypoechoic nodule in the inferior right thyroid  lobe that measures 0.8 x 0.7 x 0.9 cm and previously measured 0.8 x 0.6 x 0.8 cm. This is a TR 4 nodule. Given size (<0.9 cm) and appearance, this nodule does NOT meet TI-RADS criteria for biopsy or dedicated follow-up.   _________________________________________________________   Nodule 6 is a peripherally calcified nodule that is poorly defined in the left mid thyroid  lobe and represents the previously biopsied nodule. Exact size of this nodule is difficult to measure due to the posterior acoustic shadowing. Nodule roughly measures 2.2 x 1.5 x 1.8 cm and previously measured 2.1 x 2.1 x 1.6 cm. This is a TR 4 nodule and is not significantly changed.   Nodule # 7:   Prior biopsy: No   Location: Left; Inferior   Maximum size: 2.1 cm; Other 2 dimensions: 1.4 x 1.6 cm, previously, 1.6 x 0.9 x 1.5 cm   Composition: mixed cystic and solid (1)   Echogenicity: hypoechoic (2)   Shape: not taller-than-wide (0)   Margins: ill-defined (0)    Echogenic foci: none (0)   ACR TI-RADS total points: 3.   ACR TI-RADS risk category:  TR3 (3 points).   Significant change in size (>/= 20% in two dimensions and minimal increase of 2 mm): Yes   Change in features: No   Change in ACR TI-RADS risk category: No   ACR TI-RADS recommendations:   *Given size (>/= 1.5 - 2.4 cm) and appearance, a follow-up ultrasound in 1 year should be considered based on TI-RADS criteria.   _________________________________________________________   Multiple additional nodules that do not meet criteria for biopsy or dedicated follow-up.   IMPRESSION: 1. Multinodular goiter. 2. No significant change in the previously biopsied isthmus nodule (nodule #1) and left mid thyroid  nodule (nodule #6) 3. Nodule #2 in the right superior thyroid  lobe and nodule #7 in left inferior left thyroid  lobe both meet criteria for 1 year follow-up.      FNA Left isthmic nodule 07/23/2020 Clinical History: Isthmus; Inferior left 1.7 cm; Other 2 dimensions: 1.1  x 1.3 cm, TI-RADS total points 5  Specimen Submitted:  A. THYROID  GLAND, INFERIOR LEFT ISTHMUS, FINE  NEEDLE ASPIRATION:    FINAL MICROSCOPIC DIAGNOSIS:  - Scant follicular epithelium present (Bethesda category I)  FNA left isthmic inferior 10/30/2020 Specimen Submitted:  A. THYROID  GLAND, INFERIOR ISTHMUS LT OF ML -  NODULE #1, FINE NEEDLE ASPIRATION:   FINAL MICROSCOPIC DIAGNOSIS:  - Atypia of undetermined significance (Bethesda category III)   Afirma benign    FNA 07/23/2020 Clinical History: Left inferior 1.9 cm; Other 2 dimensions: 1.4 x 1.5  cm, TI-RADS total points 5  Specimen Submitted:  A. THYROID  GLAND, LEFT, LLP, FINE NEEDLE  ASPIRATION:    FINAL MICROSCOPIC DIAGNOSIS:  - Scant follicular epithelium present (Bethesda category I)   FNA left inferior nodule 10/30/2020  Clinical History: TI-RADS total points 5  Specimen Submitted:  A. THYROID  GLAND, LEFT, LLP - NODULE # 5, FINE   NEEDLE ASPIRATION:    FINAL MICROSCOPIC DIAGNOSIS:  - Nondiagnostic material    ASSESSMENT / PLAN / RECOMMENDATIONS:   Hyperthyroidism :   - She is clinically euthyroid  - No local neck symptoms  - Most likely due to toxic thyroid  nodule - TFT's normal , will continue current dose of methimazole     Medications : Continue Methimazole  5 mg , half tablet daily  2.  MNG :  - No local neck symptoms.  - Prior FNA of the right nodule with benign cytology (02/2018)  - FNA of the left isthmic nodule initially scant cellularity in 2021 but with Atypia of undetermined significance (Bethesda category III) in 10/2020 with Benign molecular testing  - FNA of the left inferior nodule TWICE revealed scan cellularity , but this appears to be hot on the uptake and scan  - We discussed diagnostic lobectomy vs total thyroidectomy, but she declined. -We will repeat thyroid  ultrasound     F/U in 1 yr   Signed electronically by: Natale Bail, MD  [facility]  [facility] [address] [place] [place], [place] [address] Phone: [phone] FAX: [phone]      CC: Shifflett, Aida House [address] [place] [address] Phone: [phone]  Fax: [phone]   Return to Endocrinology clinic as below: Future Appointments  Date Time Provider Department Center  12/27/2023 10:50 AM Cherre Kothari, Julian Obey, MD [id number] None

## 2023-12-28 ENCOUNTER — Encounter: Payer: Self-pay | Admitting: Internal Medicine

## 2023-12-28 MED ORDER — METHIMAZOLE 5 MG PO TABS: 2.5000 mg | ORAL_TABLET | Freq: Every day | ORAL | 3 refills | Status: AC

## 2024-01-04 ENCOUNTER — Ambulatory Visit (HOSPITAL_COMMUNITY)
Admission: RE | Admit: 2024-01-04 | Discharge: 2024-01-04 | Disposition: A | Discharge status: Home / Self Care | Source: Ambulatory Visit | Attending: Internal Medicine | Admitting: Internal Medicine

## 2024-01-04 DIAGNOSIS — E042 Nontoxic multinodular goiter: Secondary | ICD-10-CM | POA: Insufficient documentation

## 2024-01-06 ENCOUNTER — Encounter: Payer: Self-pay | Admitting: Internal Medicine

## 2024-12-26 ENCOUNTER — Ambulatory Visit: Admitting: Internal Medicine
